# Patient Record
Sex: Male | Born: 1960 | Race: White | Hispanic: No | Marital: Married | Smoking: Former smoker
Health system: Southern US, Community
[De-identification: ages and names within clinical notes are randomized; demographics above are authoritative.]

## PROBLEM LIST (undated history)

## (undated) DIAGNOSIS — R945 Abnormal results of liver function studies: Secondary | ICD-10-CM

## (undated) DIAGNOSIS — R7989 Other specified abnormal findings of blood chemistry: Secondary | ICD-10-CM

## (undated) DIAGNOSIS — K2210 Ulcer of esophagus without bleeding: Secondary | ICD-10-CM

## (undated) DIAGNOSIS — K5792 Diverticulitis of intestine, part unspecified, without perforation or abscess without bleeding: Secondary | ICD-10-CM

## (undated) DIAGNOSIS — I10 Essential (primary) hypertension: Secondary | ICD-10-CM

## (undated) DIAGNOSIS — F419 Anxiety disorder, unspecified: Secondary | ICD-10-CM

## (undated) DIAGNOSIS — K222 Esophageal obstruction: Secondary | ICD-10-CM

## (undated) DIAGNOSIS — K5790 Diverticulosis of intestine, part unspecified, without perforation or abscess without bleeding: Secondary | ICD-10-CM

## (undated) HISTORY — DX: Anxiety disorder, unspecified: F41.9

## (undated) HISTORY — DX: Other specified abnormal findings of blood chemistry: R79.89

## (undated) HISTORY — DX: Essential (primary) hypertension: I10

## (undated) HISTORY — DX: Esophageal obstruction: K22.2

## (undated) HISTORY — DX: Ulcer of esophagus without bleeding: K22.10

## (undated) HISTORY — DX: Diverticulosis of intestine, part unspecified, without perforation or abscess without bleeding: K57.90

## (undated) HISTORY — DX: Diverticulitis of intestine, part unspecified, without perforation or abscess without bleeding: K57.92

## (undated) HISTORY — DX: Abnormal results of liver function studies: R94.5

---

## 1999-03-26 ENCOUNTER — Encounter: Payer: Self-pay | Admitting: Gastroenterology

## 1999-03-26 ENCOUNTER — Ambulatory Visit (HOSPITAL_COMMUNITY): Admission: RE | Admit: 1999-03-26 | Discharge: 1999-03-26 | Payer: Self-pay | Admitting: Gastroenterology

## 1999-04-30 ENCOUNTER — Ambulatory Visit (HOSPITAL_COMMUNITY): Admission: RE | Admit: 1999-04-30 | Discharge: 1999-04-30 | Payer: Self-pay | Admitting: Gastroenterology

## 1999-07-14 ENCOUNTER — Emergency Department (HOSPITAL_COMMUNITY): Admission: EM | Admit: 1999-07-14 | Discharge: 1999-07-14 | Payer: Self-pay | Admitting: Emergency Medicine

## 1999-08-06 ENCOUNTER — Encounter: Admission: RE | Admit: 1999-08-06 | Discharge: 1999-08-06 | Payer: Self-pay | Admitting: Gastroenterology

## 1999-08-06 ENCOUNTER — Encounter: Payer: Self-pay | Admitting: Gastroenterology

## 1999-08-06 ENCOUNTER — Encounter (INDEPENDENT_AMBULATORY_CARE_PROVIDER_SITE_OTHER): Payer: Self-pay | Admitting: *Deleted

## 1999-11-21 ENCOUNTER — Encounter: Payer: Self-pay | Admitting: Internal Medicine

## 1999-11-21 ENCOUNTER — Ambulatory Visit (HOSPITAL_COMMUNITY): Admission: RE | Admit: 1999-11-21 | Discharge: 1999-11-21 | Payer: Self-pay | Admitting: Internal Medicine

## 1999-12-26 ENCOUNTER — Encounter: Payer: Self-pay | Admitting: Internal Medicine

## 1999-12-26 ENCOUNTER — Ambulatory Visit (HOSPITAL_COMMUNITY): Admission: RE | Admit: 1999-12-26 | Discharge: 1999-12-26 | Payer: Self-pay | Admitting: Internal Medicine

## 2000-04-29 ENCOUNTER — Encounter: Payer: Self-pay | Admitting: Internal Medicine

## 2000-04-29 ENCOUNTER — Ambulatory Visit (HOSPITAL_COMMUNITY): Admission: RE | Admit: 2000-04-29 | Discharge: 2000-04-29 | Payer: Self-pay | Admitting: Internal Medicine

## 2000-10-21 ENCOUNTER — Ambulatory Visit (HOSPITAL_COMMUNITY): Admission: RE | Admit: 2000-10-21 | Discharge: 2000-10-21 | Payer: Self-pay | Admitting: Internal Medicine

## 2000-10-21 ENCOUNTER — Encounter: Payer: Self-pay | Admitting: Internal Medicine

## 2001-04-16 ENCOUNTER — Emergency Department (HOSPITAL_COMMUNITY): Admission: EM | Admit: 2001-04-16 | Discharge: 2001-04-16 | Payer: Self-pay | Admitting: Emergency Medicine

## 2001-04-16 ENCOUNTER — Encounter: Payer: Self-pay | Admitting: Emergency Medicine

## 2001-06-08 ENCOUNTER — Encounter: Payer: Self-pay | Admitting: Internal Medicine

## 2001-06-08 ENCOUNTER — Ambulatory Visit (HOSPITAL_COMMUNITY): Admission: RE | Admit: 2001-06-08 | Discharge: 2001-06-08 | Payer: Self-pay | Admitting: Internal Medicine

## 2003-06-26 ENCOUNTER — Encounter (INDEPENDENT_AMBULATORY_CARE_PROVIDER_SITE_OTHER): Payer: Self-pay | Admitting: *Deleted

## 2003-06-26 ENCOUNTER — Ambulatory Visit (HOSPITAL_COMMUNITY): Admission: RE | Admit: 2003-06-26 | Discharge: 2003-06-26 | Payer: Self-pay | Admitting: Internal Medicine

## 2004-07-03 ENCOUNTER — Ambulatory Visit: Payer: Self-pay | Admitting: Internal Medicine

## 2004-07-04 ENCOUNTER — Encounter (INDEPENDENT_AMBULATORY_CARE_PROVIDER_SITE_OTHER): Payer: Self-pay | Admitting: *Deleted

## 2004-07-04 ENCOUNTER — Emergency Department (HOSPITAL_COMMUNITY): Admission: EM | Admit: 2004-07-04 | Discharge: 2004-07-04 | Payer: Self-pay | Admitting: Emergency Medicine

## 2004-07-30 ENCOUNTER — Ambulatory Visit: Payer: Self-pay | Admitting: Internal Medicine

## 2005-02-26 ENCOUNTER — Encounter (INDEPENDENT_AMBULATORY_CARE_PROVIDER_SITE_OTHER): Payer: Self-pay | Admitting: *Deleted

## 2005-02-28 ENCOUNTER — Encounter (INDEPENDENT_AMBULATORY_CARE_PROVIDER_SITE_OTHER): Payer: Self-pay | Admitting: *Deleted

## 2005-03-04 ENCOUNTER — Ambulatory Visit: Payer: Self-pay | Admitting: Internal Medicine

## 2008-08-09 ENCOUNTER — Telehealth: Payer: Self-pay | Admitting: Internal Medicine

## 2009-05-01 ENCOUNTER — Ambulatory Visit: Payer: Self-pay | Admitting: Family Medicine

## 2010-05-05 ENCOUNTER — Telehealth: Payer: Self-pay | Admitting: Internal Medicine

## 2010-05-05 DIAGNOSIS — K222 Esophageal obstruction: Secondary | ICD-10-CM

## 2010-05-05 DIAGNOSIS — Z862 Personal history of diseases of the blood and blood-forming organs and certain disorders involving the immune mechanism: Secondary | ICD-10-CM

## 2010-05-05 DIAGNOSIS — K2210 Ulcer of esophagus without bleeding: Secondary | ICD-10-CM

## 2010-05-05 DIAGNOSIS — R198 Other specified symptoms and signs involving the digestive system and abdomen: Secondary | ICD-10-CM

## 2010-05-05 DIAGNOSIS — F411 Generalized anxiety disorder: Secondary | ICD-10-CM | POA: Insufficient documentation

## 2010-05-05 DIAGNOSIS — R109 Unspecified abdominal pain: Secondary | ICD-10-CM | POA: Insufficient documentation

## 2010-05-05 DIAGNOSIS — Z8639 Personal history of other endocrine, nutritional and metabolic disease: Secondary | ICD-10-CM

## 2010-05-06 ENCOUNTER — Ambulatory Visit: Payer: Self-pay | Admitting: Internal Medicine

## 2010-05-06 LAB — CONVERTED CEMR LAB
Basophils Absolute: 0 10*3/uL (ref 0.0–0.1)
Basophils Relative: 0.5 % (ref 0.0–3.0)
Eosinophils Absolute: 0.2 10*3/uL (ref 0.0–0.7)
Eosinophils Relative: 2.6 % (ref 0.0–5.0)
HCT: 43.5 % (ref 39.0–52.0)
Hemoglobin: 15.1 g/dL (ref 13.0–17.0)
Lymphocytes Relative: 23.8 % (ref 12.0–46.0)
Lymphs Abs: 1.6 10*3/uL (ref 0.7–4.0)
MCHC: 34.7 g/dL (ref 30.0–36.0)
MCV: 89 fL (ref 78.0–100.0)
Monocytes Absolute: 0.7 10*3/uL (ref 0.1–1.0)
Monocytes Relative: 10.1 % (ref 3.0–12.0)
Neutro Abs: 4.4 10*3/uL (ref 1.4–7.7)
Neutrophils Relative %: 63 % (ref 43.0–77.0)
Platelets: 189 10*3/uL (ref 150.0–400.0)
RBC: 4.89 M/uL (ref 4.22–5.81)
RDW: 13.4 % (ref 11.5–14.6)
WBC: 6.9 10*3/uL (ref 4.5–10.5)

## 2010-05-07 ENCOUNTER — Ambulatory Visit: Payer: Self-pay | Admitting: Cardiology

## 2010-07-18 ENCOUNTER — Ambulatory Visit: Admit: 2010-07-18 | Payer: Self-pay | Admitting: Internal Medicine

## 2010-08-05 NOTE — Letter (Signed)
Summary: SER-Abdomen Spine and Erect View  SER-Abdomen Spine and Erect View   Imported By: Lamona Curl CMA (AAMA) 05/05/2010 17:02:54  _____________________________________________________________________  External Attachment:    Type:   Image     Comment:   External Document

## 2010-08-05 NOTE — Procedures (Signed)
Summary: EGD  Patient Name: Chad [name], Chad [name] MRN: [id number] Procedure Procedures: Panendoscopy (EGD) CPT: 43235.    with esophageal dilation. CPT: G9296129.  Personnel: Endoscopist: Dora L. Juanda Chance, MD.  Exam Location: Exam performed in Endoscopy Suite. Outpatient  Patient Consent: Procedure, Alternatives, Risks and Benefits discussed, consent obtained, from patient.  Indications  Therapeutics: Reason for exam: Esophageal dilation.  Symptoms: Dysphagia.  Surveillance of: follow up esophageal  stricture, recurrent solid food dysphagia.  Comments: Savory dilators passed under fluro  without difficulty 14, 15 , 16, 17,mm there was blood on the last dilator History  Pre-Exam Physical: Performed Apr 29, 2000  Cardio-pulmonary exam, HEENT exam, Abdominal exam, Extremity exam, Neurological exam WNL.  Exam Exam Info: Maximum depth of insertion Duodenum, intended Duodenum. Gastric retroflexion performed. Images taken. ASA Classification: I. Tolerance: excellent.  Sedation Meds: Demerol 100 mg. Versed 12.5  Findings STRICTURE / STENOSIS: Proximal Esophagus.  Constriction: partial. Etiology: benign other, see comments. 15 cm from mouth. ICD9: Esophageal Stricture: 530.3.   Assessment Abnormal examination, see findings above.  Diagnoses: 530.3: Esophageal Stricture.   Events  Unplanned Intervention: No unplanned interventions were required.  Unplanned Events: There were no complications. Plans Medication(s): Continue current medications.  Patient Education: Patient given standard instructions for: Stenosis / Stricture.   This report was created from the original endoscopy report, which was reviewed and signed by the above listed endoscopist.

## 2010-08-05 NOTE — Assessment & Plan Note (Signed)
Summary: Gastroenterology  Chad [name] MR#:  [id number] Page #  NAME:  Chad [name], Chad [name]  OFFICE NO:  [id number]  DATE:  03/04/05  DOB:  [birth year]  HISTORY OF PRESENT ILLNESS:  The patient is a very nice 50 year old gentleman with a benign esophageal stricture requiring periodic dilatation, last time in December 2005. He had a new problem with acute left lower quadrant abdominal pain, which woke him up approximately 2 weeks ago and lasted several days. It subsided spontaneously without any treatment. It was located anteriorly, did not radiate to the back or to his legs. He has normal bowel habits and has no nausea, vomiting, or food intolerance. There was no fever or rectal bleeding. There is a family history of colitis in his father.  When seen by Dr. Susann Givens, ultrasound of the upper abdomen was unremarkable, including his gallbladder; amylase, lipase. His liver transaminases have been slightly elevated 2 to 3 times normal with normal alkaline phosphatase and serum albumin. He has been told in the past, as long as he remembers, that his liver functions have been abnormal. He denies using alcohol excessively or having family history of liver disease. He has never had blood transfusions or used illicit drugs. His hepatitis A, B, C serologies have been negative as per Dr. Susann Givens.  PHYSICAL EXAM:  Blood pressure 120/60, pulse of 72, and weight 195 pounds. He appeared healthy, well suntanned. No rashes, no palmar erythema, and sclerae nonicteric. Lungs were clear to auscultation. COR with normal S1 and normal S2. Abdomen was soft, mildly protuberant, with normal liver edge at costal margin; spleen not enlarged; minimal tenderness in left lower quadrant; no rebound; no fullness. Rectal exam with normal prostate; stool was Hemoccult negative.  IMPRESSION: 1.  A 50 year old gentleman with acute left lower quadrant abdominal pain, which has resolved spontaneously without antibiotics or dietary restrictions. It could  represent a colon spasm, symptomatic diverticulosis, or possibly segmental ischemic colitis. The pain has resolved. He has no risk factors for colon cancer, and he is only 50 years old. Therefore, colonoscopy is not required. 2.  Benign distal esophageal stricture. 3.  Abnormal liver function tests, specifically transaminases; unknown etiology. Rule out Wilson disease. Rule out hemochromatosis. Rule out autoimmune liver disease and also A-1 antitrypsin deficiency.  PLAN: 1.  Obtain antimitochondrial antibody, antinuclear antibodies, smooth muscle antibodies, serum ferritin, IgG and IgM levels. 2.  Continue AcipHex 20 mg p.o. b.i.d. 3.  If the pain in left lower quadrant recurs, I would like to see him and do a CT scan with attention to left lower quadrant. 4.  Samples of Pamine 5 mg to use as an antispasmodic p.r.n. abdominal pain.      Hedwig Morton. Juanda Chance, M.D.  [id number] cc:  Ronnald Nian, MD D:  03/04/05; T:  ; Job [phone]

## 2010-08-05 NOTE — Assessment & Plan Note (Signed)
Summary: abd pain, loose stool x week/regina    History of Present Illness Visit Type: Initial Visit Primary GI MD: Lina Sar MD Primary Provider: [facility] Chief Complaint: LLQ pain, loose bloody stools x 8 days this episode History of Present Illness:   This is a 50 year old white male with a history of a benign proximal esophageal stricture now with new complains of a one week history of left lower quadrant abdominal pain which has bothered him during the day as well as at night. There was a low-grade temperature 2 days ago. His bowel habits have been regular and he denies any upper GI symptoms. He has never had a colonoscopy. He has seen some blood  mixed with bowel movements. There is no family history of colon cancer.   GI Review of Systems    Reports abdominal pain, acid reflux, and  dysphagia with solids.     Location of  Abdominal pain: RLQ.    Denies belching, bloating, chest pain, dysphagia with liquids, heartburn, loss of appetite, nausea, vomiting, vomiting blood, weight loss, and  weight gain.      Reports change in bowel habits, diarrhea, and  rectal bleeding.     Denies anal fissure, black tarry stools, constipation, diverticulosis, fecal incontinence, heme positive stool, hemorrhoids, irritable bowel syndrome, jaundice, light color stool, liver problems, and  rectal pain. Preventive Screening-Counseling & Management  Alcohol-Tobacco     Smoking Status: quit    Current Medications (verified): 1)  Xanax 0.5 Mg Tabs (Alprazolam) .... As Needed 2)  Omeprazole 20 Mg Cpdr (Omeprazole) .... Once Daily 3)  Minocycline Hcl 100 Mg Tabs (Minocycline Hcl) .... Once Weekly  Allergies (verified): No Known Drug Allergies  Past History:  Past Medical History: Reviewed history from 05/05/2010 and no changes required. Current Problems:  Hx of ULCER OF ESOPHAGUS WITHOUT BLEEDING (ICD-530.20) ANXIETY (ICD-300.00) STRICTURE AND STENOSIS OF ESOPHAGUS  (ICD-530.3) LIVER FUNCTION TESTS, ABNORMAL, HX OF (ICD-V12.2)    Past Surgical History: Reviewed history from 05/05/2010 and no changes required. Unremarkable  Family History: Reviewed history from 05/05/2010 and no changes required. No FH of Colon Cancer:  Social History: Alcohol Use - yes-rare Illicit Drug Use - no Occupation: Nature conservation officer Patient is a former smoker.  Daily Caffeine Use Smoking Status:  quit  Review of Systems       The patient complains of anxiety-new and change in vision.  The patient denies allergy/sinus, anemia, arthritis/joint pain, back pain, blood in urine, breast changes/lumps, confusion, cough, coughing up blood, depression-new, fainting, fatigue, fever, headaches-new, hearing problems, heart murmur, heart rhythm changes, itching, menstrual pain, muscle pains/cramps, night sweats, nosebleeds, pregnancy symptoms, shortness of breath, skin rash, sleeping problems, sore throat, swelling of feet/legs, swollen lymph glands, thirst - excessive , urination - excessive , urination changes/pain, urine leakage, vision changes, and voice change.         Pertinent positive and negative review of systems were noted in the above HPI. All other ROS was otherwise negative.   Vital Signs:  Patient profile:   50 year old male Height:      72 inches Weight:      205.25 pounds BMI:     27.94 Pulse rate:   68 / minute Pulse rhythm:   regular BP sitting:   124 / 96  (left arm) Cuff size:   regular  Vitals Entered By: June McMurray CMA Duncan Dull) (May 06, 2010 10:41 AM)  Physical Exam  General:  Well developed, well nourished, no  acute distress. Eyes:  PERRLA, no icterus. Mouth:  No deformity or lesions, dentition normal. Neck:  Supple; no masses or thyromegaly. Lungs:  Clear throughout to auscultation. Heart:  Regular rate and rhythm; no murmurs, rubs,  or bruits. Abdomen:  soft abdomen with normal bowel sounds. Mild tenderness on deep pressure and left  lower quadrant and left middle quadrants without palpable mass or fullness. Upper quadrants were normal, liver edge at costal margin. Right lower quadrant unremarkable. Rectal:  soft trace Hemoccult-positive stool. Extremities:  No clubbing, cyanosis, edema or deformities noted. Skin:  Intact without significant lesions or rashes. Psych:  Alert and cooperative. Normal mood and affect.   Impression & Recommendations:  Problem # 1:  ABDOMINAL PAIN, UNSPECIFIED SITE (ICD-789.00) Patient has acute left lower quadrant abdominal pain suggestive of either colitis or diverticulitis. He has hemoccult-Positive stool on my exam and also reports blodd in the BM's.. His low-grade temperature points to an inflammatory process but the bleeding raises a concern for colitis or possibly for neoplasm. We will proceed with a CT scan of the abdomen and pelvis with intravenous and oral contrast and start the patient on Cipro 500 mg twice a day and Flagyl 250 mg 3 times a day. He was instructed in a full liquid diet and we will check a CBC today. Orders: CT Abdomen/Pelvis with Contrast (CT Abd/Pelvis w/con) TLB-CBC Platelet - w/Differential (85025-[id number])  Problem # 2:  Hx of ULCER OF ESOPHAGUS WITHOUT BLEEDING (ICD-530.20) Patient is status post esophageal dilatation. His last dilation was in December 2005. He denies any dysphagia. He is on omeprazole 20 mg daily.  Patient Instructions: 1)  You have been scheduled for a CT scan of the abdomen and pelvis at [place] [facility] on 1:30 pm on 05/07/10 2)  Cipro 500 mg twice a day for 10 days. 3)  Flagyl 250 mg 3 times a day x 10 days. 4)  Full liquid diet. 5)  CBC with differential today. 6)  Please pick up your prescriptions at the pharmacy. Electronic prescription(s) has already been sent.  7)  Copy sent to : [facility] 8)  The medication list was reviewed and reconciled.  All changed / newly prescribed medications were explained.  A complete medication list was  provided to the patient / caregiver. Prescriptions: CIPRO 500 MG TABS (CIPROFLOXACIN HCL) Take 1 tablet by mouth two times a day x 10 days  #20 x 1   Entered by:   Lamona Curl CMA (AAMA)   Authorized by:   Hart Carwin MD   Signed by:   Lamona Curl CMA (AAMA) on 05/06/2010   Method used:   Electronically to        [facility]  [address] [phone]* (retail)       [address] [place], [place]  [address]       Ph: [phone] or [phone]       Fax: [phone]   RxID:   [id number] FLAGYL 250 MG TABS (METRONIDAZOLE) Take 1 tablet by mouth three times a day x 10 days  #30 x 1   Entered by:   Lamona Curl CMA (AAMA)   Authorized by:   Hart Carwin MD   Signed by:   Lamona Curl CMA (AAMA) on 05/06/2010   Method used:   Electronically to        [facility]  [address] [phone]* (retail)  [address]       [place], [place]  [address]       Ph: [phone] or [phone]       Fax: [phone]   RxID:   [id number]

## 2010-08-05 NOTE — Letter (Signed)
Summary: SER-Abdominal Ultrasound  SER-Abdominal Ultrasound   Imported By: Lamona Curl CMA (AAMA) 05/05/2010 17:02:16  _____________________________________________________________________  External Attachment:    Type:   Image     Comment:   External Document

## 2010-08-05 NOTE — Op Note (Signed)
Summary: EGD                         [facility]  Patient:    OLA, FAWVER Visit Number: [id number] MRN: [id number]          Service Type: END Location: ENDO Attending Physician:  Mervin Hack Dictated by:   Hedwig Morton. Juanda Chance, M.D. [facility] Admit Date:  06/08/2001                             Procedure Report  PROCEDURE:  Upper endoscopy with esophageal dilation.  INDICATION:  This 50 year old gentleman has a history of benign proximal esophageal stricture.  He was dilated on April 29, 2000, and again on October 21, 2000, using Savary dilators, dilatation was up to 17 mm.  He has been on Prevacid 30 mg a day and the past four weeks has developed progressive solid-food dysphagia.  He could hardly eat his Thanksgiving dinner, had to blenderize his [place] meat.  He is now undergoing a repeat endoscopy with dilatation.  ENDOSCOPE:  Olympus single-channel video scope.  SEDATION:  Versed 10 mg IV, Demerol 90 mg IV.  FINDINGS:  The Olympus single-channel video scope passed under direct vision through posterior pharynx into the esophagus.  The patient was monitored by pulse oximetry.  His oxygen saturations were normal, and he was cooperative. There was a proximal esophageal stricture at 17 cm from the incisors, which showed closed lumen.  Endoscope traversed through with only mild pressure. The rest of the stomach was normal with mild fibrous ring, which was not obstructing at the GE junction, with a small hiatal hernia.  Stomach was normal as well as duodenum and duodenal bulb.  A guidewire was then placed under fluoroscopic guidance and Savary dilators passed over the guidewire, using 12, 14, 15, 16, 17, and 18 mm dilators.  There was mild small amount of blood on the last three dilators.   The patient tolerated the procedure well.  IMPRESSION:  Benign proximal esophageal stricture, status post dilatation to 18 mm.  PLAN: 1. Increase Prevacid to 30 mg p.o.  b.i.d. 2. Continue antireflux measures. 3. Repeat dilatation on a p.r.n. basis. Dictated by:   Hedwig Morton. Juanda Chance, M.D. [facility] Attending Physician:  Mervin Hack DD:  06/08/01 TD:  06/08/01 Job: [phone] [id number]

## 2010-08-05 NOTE — Progress Notes (Signed)
Summary: Triage   Phone Note Call from Patient Call back at Home Phone [phone]   Caller: Patient Call For: Dr. Juanda Chance Reason for Call: Talk to Nurse Summary of Call: Diarrhea and lower abd pain x1 week Initial call taken by: Karna Christmas,  May 05, 2010 8:51 AM  Follow-up for Phone Call        Patient called requesting an appointment to see Dr. Juanda Chance. States he has had  left lower abdominal pain for one week. Describes pain as waves of knife like pain. States he as loose, brown stool with some mucous about every hour. Reports one espisode of blood with stool last week but he cannot describe if it was bright red or dark red blood. Denies fever, nausea and vomiting. Patient reports an episode like this 2 months ago that resolved on its on. Patient is taking Tylenol without relief. Patient offered appointment with PA today or Dr. Juanda Chance tomorrow. Scheduled patient to see Dr. Juanda Chance 05/06/10 @ 10:15 AM. Follow-up by: Jesse Fall RN,  May 05, 2010 9:41 AM

## 2010-08-05 NOTE — Procedures (Signed)
Summary: EGD  [facility]] Patient Name: Chad [name], Osuna MRN: [id number] Procedure Procedures: Panendoscopy (EGD) CPT: 43235.    with biopsy(s)/brushing(s). CPT: D1846139.    with esophageal dilation. CPT: G9296129.  Personnel: Endoscopist: Dora L. Juanda Chance, MD.  Exam Location: Exam performed in Outpatient Clinic. Outpatient  Patient Consent: Procedure, Alternatives, Risks and Benefits discussed, consent obtained, from patient. Consent was obtained by the RN.  Indications Symptoms: Dysphagia.  Surveillance of: 2004.  Comments: history of esophageal stricture History  Current Medications: Patient is not currently taking Coumadin.  Pre-Exam Physical: Performed Jul 03, 2004  Entire physical exam was normal.  Exam Exam Info: Maximum depth of insertion Duodenum, intended Duodenum. Vocal cords visualized. Gastric retroflexion performed. Images taken. ASA Classification: I. Tolerance: good.  Sedation Meds: Patient assessed and found to be appropriate for moderate (conscious) sedation. Fentanyl 100 mcg. given IV. Versed 10 mg. given IV. Cetacaine Spray 2 sprays given aerosolized.  Monitoring: BP and pulse monitoring done. Oximetry used. Supplemental O2 given  Findings - STRICTURE / STENOSIS: Stricture in Proximal Esophagus.  Constriction: partial. Etiology: benign due to reflux. 15 cm from mouth. Lumen diameter is 15 mm. ICD9: Esophageal Stricture: 530.3. Comment: no significant narrowing of the lumen noted.  - Dilation: Proximal Esophagus. Maloney dilator used, Diameter: 42,48, 50,54 mm, Minimal Resistance, Moderate Heme present on extraction. 4  total dilators used. Patient tolerance fair. Outcome: successful.  - DIAGNOSTIC TEST: Biopsy taken. from Distal Esophagus. Reason: r/o Barrett's esophagus.   Assessment Abnormal examination, see findings above.  Diagnoses: 530.3: Esophageal Stricture.   Comments: s/p dilation of proximal es.stricture, s/p  biopsies Events  Unplanned Intervention: No unplanned interventions were required.  Unplanned Events: There were no complications. Plans Medication(s): Await pathology. PPI: Aciphex 20 mg BID, starting Jul 03, 2004   Comments: will need to increase PPI's to bid regimen to reduce the frequency of the es. dilations Disposition: After procedure patient sent to recovery. After recovery patient sent home.    This report was created from the original endoscopy report, which was reviewed and signed by the above listed endoscopist.

## 2010-08-05 NOTE — Op Note (Signed)
Summary: EGD  NAME:  Chad [name], MCINTYRE                          ACCOUNT NO.:  [id number]   MEDICAL RECORD NO.:  [id number]                   PATIENT TYPE:  AMB   LOCATION:  ENDO                                 FACILITY:  [facility]   PHYSICIAN:  Lina Sar, M.D. [facility]               DATE OF BIRTH:  March 20, 1961   DATE OF PROCEDURE:  06/26/2003  DATE OF DISCHARGE:                                 OPERATIVE REPORT   NAME OF PROCEDURE:  Upper endoscopy and esophageal dilation.   INDICATIONS:  This 50 year old gentleman has a benign proximal esophageal  stricture which was dilated on several previous occasions, first in October  2001, the last time in December 2002.  He has been fine for two years, but  in the last month has developed a recurrent solid food dysphagia.  He has  been on Prevacid 30 mg p.o. b.i.d., and antireflux measures.   ENDOSCOPE:  Olympus single chamber scope.   SEDATION:  1. Versed 12 mg IV.  2. Fentanyl 100 mcg IV.   FINDINGS:  The Olympus single chamber video endoscope passed under direct  visualization into the oral cavity through the posterior pharynx into the  distal esophagus.  The patient was monitored by pulse oximeter.  His oxygen  saturations were normal.  He was cooperative.  There was a proximate  esophageal stricture at 17 cm from the incisors which appeared to have a  spasm and erythematous mucosa.  Endoscope passed through without much  resistance, but the lumen did not stay open, I had to push through several  times back and forth to keep it open.  The rest of the esophagus was  unremarkable.  There was no stricture distally.  There was only small  __________ reducible hiatal hernia.   Stomach was insufflated with air and showed normal appearing gastric mucosa.  Gastric antrum and the folds.  Retroflexion of the endoscope revealed normal  fundus and cardia.   The duodenum bulb and descending duodenum was normal.  Guide wire was then  placed into the  stomach and Savary dilator passed over the guide wire using  12, 14, 15, 16, 17, and 18 mm dilators.  There was blood on the last two  dilators.  The patient tolerated the procedure well.   IMPRESSION:  Benign proximal esophageal stricture, status post dilatation to  18 mm.   PLAN:  1. We are going to switch the patient from Prevacid to Nexium 40 mg p.o.     b.i.d.  2. Anti-reflux measures.  3. To return for re-dilatation on a p.r.n. basis.                                               Lina Sar, M.D. [facility]   DB/MEDQ  D:  06/26/2003  T:  06/26/2003  Job:  [id number]

## 2010-11-21 NOTE — Procedures (Signed)
[facility]  Patient:    Chad [name], Chad [name]                       MRN: [id number] Adm. Date:  [id number] Attending:  Mervin Hack                           Procedure Report  PROCEDURE:  Upper endoscopy with esophageal dilatation.  ENDOSCOPIST:  Hedwig Morton. Juanda Chance, M.D.  INDICATION:  This 50 year old gentleman has a history of benign proximal esophageal stricture which was dilated on two previous occasions, last time on April 29, 2000, using Savary dilators under fluoroscopic guidance up to 17 mm.  He has done very well for five months, until several days ago when he developed acute solid food dysphagia and has had persistent odynophagia and difficulty with swallowing solids.  He has been on Prevacid 30 mg a day.  He is now undergoing upper endoscopy and esophageal dilatation.  ENDOSCOPE:  Olympus single-channel video endoscope.  SEDATION:  Versed 7.5 mg IV, Demerol 100 mg IV.  FINDINGS:  Olympus single-channel video endoscope was passed under direct vision to posterior pharynx into esophagus.  Patient was monitored by pulse oximetry; his oxygen saturations were normal.  He was very cooperative.  In the proximal esophagus at the level of 15 to 20 cm from the oral cavity was a marked fibrotic esophageal stricture which allowed the endoscope to traverse through without bleeding but with only minimal resistance.  There were no ulcerations or erosions.  The rest of the esophagus was unremarkable.  Stomach:  Gastric mucosa was normal throughout with normal pyloric outlet. Retroflexion of the scope revealed normal fundus and cardia.  Duodenum:  Duodenal bulb and descending duodenum were normal.  A guidewire was then placed through the endoscope under fluoroscopic guidance. Endoscope was retracted and Savary dilators passed over the guidewire using fluoroscopy for confirmation; 14-mm, 16-mm, 17-mm and 18-mm Savary dilators passed through the esophagus  with mild resistance.  There was a small amount of blood on the last two dilators.  Patient tolerated procedure well.  IMPRESSION:  Benign proximal esophageal stricture, status post dilatation to 18 mm.  PLAN: 1. Continue Prevacid 30 mg a day. 2. Liquids for two hours, then soft foods. 3. Patient will return for repeat dilatation on a p.r.n. basis. DD:  10/21/00 TD:  10/22/00 Job: [id number]

## 2010-11-21 NOTE — Procedures (Signed)
[place]  Patient:    Chad [name], Chad [name]                       MRN: [id number] Proc. Date: 11/21/99 Adm. Date:  [id number] Disc. Date: [id number] Attending:  Mervin Hack CC:         Lucky Cowboy, M.D.                           Procedure Report  PROCEDURE PERFORMED:  Upper endoscopy with Savary dilation.  ENDOSCOPIST:  Hedwig Morton. Juanda Chance, M.D. [facility]  INDICATIONS FOR PROCEDURE:  This 50 year old white male has chronic solid food dysphagia.  He was diagnosed with benign proximal esophageal stricture in 1993 and the stricture was asymptomatic for several years but about a year ago, he developed progressive solid food dysphagia.  He has some from 186 to currently 155 pounds.  He denies odynophagia.  Barium swallow in April 2000 compared with recent barium swallow confirms small proximal esophageal stricture, most likely benign with maximum diameter of 9 mm.  There was a question of Barretts esophagus.  The patient has been on Prevacid which he empties into a glass of water because he cannot swallow pills.  He has been essentially on liquids only.  He is undergoing upper endoscopy and esophageal dilation of the proximal esophageal stricture.  ENDOSCOPE:  Olympus Signature endoscope.  SEDATION:  Versed 12 mg IV, Demerol 100 mg IV.  DESCRIPTION OF PROCEDURE:  Olympus Signature endoscope passed ____________ posterior pharynx into esophagus.  Patient  monitored by pulse oximetery, oxygen saturations were 98 to 100%.  He was very cooperative.  Gag reflex was present.  Vallecula and piriform sinuses were unremarkable.  Proximal esophagus from 15 to 25 cm from the incisors, lumen was concentrically narrowed and would not allow the endoscope initially to pass through.  The diameter was less than 11 mm.  The mucosa appeared whitish, noninflamed, no erosions or ulcerations.  As the endoscope pushed through the proximal esophagus, there were some abrasions in  the mucosa of the esophageal stricture.  Guide wire was then placed through the endoscope into the stomach under fluoroscopic guidance.  From that point we were able to advance the endoscope all the way through the esophagus.  No significant distal strictures were noted.  There was a small sliding hiatal hernia which was not clinically significant ____________ gastric folds, gastric antrum and pylorus ____________ normal as well as duodenum.  As the endoscope was retracted the Savary dilators passed over the guide wire under fluoroscopic guidance using 12 mm, 12.8, 14 and 15 mm dilators.  There was a little blood on the last dilator and some blood on the 14 mm dilator.  The patient tolerated the procedure well.  IMPRESSION:  Benign proximal and distal esophageal stricture, status post dilation to 45 [place].  Chronic esophagitis.  PLAN: 1. Patient will have to return in four weeks for redilatation. 2. Begin Prilosec elixir 20 mg twice a day. 3. Antireflux measures and soft diet with dietary supplement to improve his    nutritional status.  I suspected the problem with the stricture is also    partly due to dysmotility over the proximal esophageal segment which lacks    peristaltic waves and compounds the problem of dysphagia. DD:  11/21/99 TD:  11/26/99 Job: [id number]

## 2010-11-21 NOTE — Procedures (Signed)
[facility]  Patient:    SONIA, STICKELS Visit Number: [id number] MRN: [id number]          Service Type: END Location: ENDO Attending Physician:  Mervin Hack Dictated by:   Hedwig Morton. Juanda Chance, M.D. [facility] Admit Date:  06/08/2001                             Procedure Report  PROCEDURE:  Upper endoscopy with esophageal dilation.  INDICATION:  This 50 year old gentleman has a history of benign proximal esophageal stricture.  He was dilated on April 29, 2000, and again on October 21, 2000, using Savary dilators, dilatation was up to 17 mm.  He has been on Prevacid 30 mg a day and the past four weeks has developed progressive solid-food dysphagia.  He could hardly eat his Thanksgiving dinner, had to blenderize his [place] meat.  He is now undergoing a repeat endoscopy with dilatation.  ENDOSCOPE:  Olympus single-channel video scope.  SEDATION:  Versed 10 mg IV, Demerol 90 mg IV.  FINDINGS:  The Olympus single-channel video scope passed under direct vision through posterior pharynx into the esophagus.  The patient was monitored by pulse oximetry.  His oxygen saturations were normal, and he was cooperative. There was a proximal esophageal stricture at 17 cm from the incisors, which showed closed lumen.  Endoscope traversed through with only mild pressure. The rest of the stomach was normal with mild fibrous ring, which was not obstructing at the GE junction, with a small hiatal hernia.  Stomach was normal as well as duodenum and duodenal bulb.  A guidewire was then placed under fluoroscopic guidance and Savary dilators passed over the guidewire, using 12, 14, 15, 16, 17, and 18 mm dilators.  There was mild small amount of blood on the last three dilators.   The patient tolerated the procedure well.  IMPRESSION:  Benign proximal esophageal stricture, status post dilatation to 18 mm.  PLAN: 1. Increase Prevacid to 30 mg p.o. b.i.d. 2. Continue  antireflux measures. 3. Repeat dilatation on a p.r.n. basis. Dictated by:   Hedwig Morton. Juanda Chance, M.D. [facility] Attending Physician:  Mervin Hack DD:  06/08/01 TD:  06/08/01 Job: [id number]

## 2010-11-21 NOTE — Op Note (Signed)
NAME:  Chad [name], SERVISS NO.:  [id number]   MEDICAL RECORD NO.:  [id number]                   PATIENT TYPE:  AMB   LOCATION:  ENDO                                 FACILITY:  [facility]   PHYSICIAN:  Lina Sar, M.D. [facility]               DATE OF BIRTH:  1960-07-16   DATE OF PROCEDURE:  06/26/2003  DATE OF DISCHARGE:                                 OPERATIVE REPORT   NAME OF PROCEDURE:  Upper endoscopy and esophageal dilation.   INDICATIONS:  This 50 year old gentleman has a benign proximal esophageal  stricture which was dilated on several previous occasions, first in October  2001, the last time in December 2002.  He has been fine for two years, but  in the last month has developed a recurrent solid food dysphagia.  He has  been on Prevacid 30 mg p.o. b.i.d., and antireflux measures.   ENDOSCOPE:  Olympus single chamber scope.   SEDATION:  1. Versed 12 mg IV.  2. Fentanyl 100 mcg IV.   FINDINGS:  The Olympus single chamber video endoscope passed under direct  visualization into the oral cavity through the posterior pharynx into the  distal esophagus.  The patient was monitored by pulse oximeter.  His oxygen  saturations were normal.  He was cooperative.  There was a proximate  esophageal stricture at 17 cm from the incisors which appeared to have a  spasm and erythematous mucosa.  Endoscope passed through without much  resistance, but the lumen did not stay open, I had to push through several  times back and forth to keep it open.  The rest of the esophagus was  unremarkable.  There was no stricture distally.  There was only small  __________ reducible hiatal hernia.   Stomach was insufflated with air and showed normal appearing gastric mucosa.  Gastric antrum and the folds.  Retroflexion of the endoscope revealed normal  fundus and cardia.   The duodenum bulb and descending duodenum was normal.  Guide wire was then  placed into the stomach and Savary  dilator passed over the guide wire using  12, 14, 15, 16, 17, and 18 mm dilators.  There was blood on the last two  dilators.  The patient tolerated the procedure well.   IMPRESSION:  Benign proximal esophageal stricture, status post dilatation to  18 mm.   PLAN:  1. We are going to switch the patient from Prevacid to Nexium 40 mg p.o.     b.i.d.  2. Anti-reflux measures.  3. To return for re-dilatation on a p.r.n. basis.                                               Lina Sar, M.D. [facility]   DB/MEDQ  D:  06/26/2003  T:  06/26/2003  Job:  [id number]

## 2010-11-21 NOTE — Op Note (Signed)
[facility]  Patient:    JALEEN, FINCH                       MRN: [id number] Adm. Date:  [id number] Disc. Date: [id number] Attending:  Mervin Hack CC:         Everardo All. Madilyn Fireman, M.D.                           Operative Report  PROCEDURE:  Upper endoscopy and esophageal dilation.  INDICATION:  This 50 year old gentleman has been evaluated for a proximal esophageal stricture and solid food dysphagia with resulting weight loss.  He underwent his first proximal esophageal dilatation on Nov 21, 1999 using Savary dilators up to 15 mm.  He had three to four days of burning and some hematemesis but his dysphagia has mostly resolved.  He has gained some weight, his energy has improved and he has been able to eat solid food.  He still has had occasional hesitation of the food and for that reason, he is undergoing a repeat esophageal dilatation four weeks later.  ENDOSCOPE:  Olympus single-channel videoscope.  ENDOSCOPIST:  Hedwig Morton. Juanda Chance, M.D.  SEDATION:  Versed 10 mg IV, Demerol 100 mg IV.  FINDINGS:  Olympus single-channel videoscope was passed under direct vision through the posterior pharynx into the esophagus.  Patient was again monitored by pulse oximetry; his oxygen saturations were normal.  At this time, endoscope advanced through the proximal esophagus without hesitation.  There was only slight hindrance proximally at the area of smooth narrowing at the level of 15 cm from the incisors.  There was no active erosion, laceration or any sign of acute inflammation.  The stricture was smooth, fibrotic, easily dilated with the scope.  Diameter of the stricture at this time was at least 12 mm.  Rest of the esophagus was unremarkable.  Guidewire was then placed into the stomach with fluoroscopic guidance and Savary dilators passed over the guidewire using 12.8-, 14.0-, 15.0- and 16.0-mm dilators.  There was a significant amount of blood on the last  dilator.  Patient tolerated the procedure well.  IMPRESSION: 1. Proximal esophageal stricture, status post dilatation to 48-French. 2. Proximal esophagitis.  PLAN: 1. Patient is to continue on the Prilosec 20 mg a day. 2. Antireflux measures with head of bed elevation at night. 3. He will return in eight weeks for repeat dilatation and re-endoscopy.  At    that time, we may be able to dilate him to 51-French and from there on, he    may not need further dilatation.  He will have to stay on chronic PPIs.  We    will monitor his progress as an outpatient in the office. DD:  12/26/99 TD:  12/29/99 Job: [id number]

## 2011-09-14 ENCOUNTER — Telehealth: Payer: Self-pay | Admitting: Internal Medicine

## 2011-09-14 NOTE — Telephone Encounter (Signed)
Left a message for patient's wife to call me. 

## 2011-09-14 NOTE — Telephone Encounter (Signed)
Spoke with patient's wife and she states he went to the ED at [facility] over the weekend due to getting a pill stuck in throat. They gave him something to relax and he coughed up the pill. He is eating a soft diet. Scheduled patient with Willette Cluster, NP on 3/112/13 at 2:00 PM. Hx benign proximal esophageal stricture.

## 2011-09-15 ENCOUNTER — Encounter: Payer: Self-pay | Admitting: Internal Medicine

## 2011-09-15 ENCOUNTER — Ambulatory Visit (INDEPENDENT_AMBULATORY_CARE_PROVIDER_SITE_OTHER): Payer: BC Managed Care – PPO | Admitting: Nurse Practitioner

## 2011-09-15 ENCOUNTER — Encounter: Payer: Self-pay | Admitting: Nurse Practitioner

## 2011-09-15 DIAGNOSIS — R1310 Dysphagia, unspecified: Secondary | ICD-10-CM | POA: Insufficient documentation

## 2011-09-15 NOTE — Progress Notes (Signed)
i agree with plan outlined in this note 

## 2011-09-15 NOTE — Patient Instructions (Signed)
We scheduled the colonoscopy with Dr. Juanda Chance. Directions provided.

## 2011-09-15 NOTE — Progress Notes (Signed)
Chad [name] [id number] 04-12-1961   HISTORY OR PRESENT ILLNESS :  Patient is a 51 year old male known to Dr. Juanda Chance for a history of sigmoid diverticulitis and recurrent proximal esophageal strictures requiring dilation (last dilated in 2005). Patient has no significant heartburn unless he eats very spicy food. He takes Prilosec but not on a daily basis as insurance doesn't pay for PPI. Patient comes in today because he recently "got choked" on an allergy pill. Though patient doesn't feels his chronic dysphagia has gotten worse, his wife states it has and describes how the patient uses different head/neck maneuvers to facilitate passage of food. Patient is eating small bites of food to avoid impaction. His last esophageal dilation was in 2005, it did help with swallowing and patient feels he may be in need of another dilation.  No other gastrointestinal complaints  Current Medications, Allergies, Past Medical History, Past Surgical History, Family History and Social History were reviewed in [employer] record.   PHYSICAL EXAMINATION : General: Well developed white male in no acute distress Head: Normocephalic and atraumatic Eyes:  sclerae anicteric,conjunctive pink. Ears: Normal auditory acuity Neck: Supple, no masses.  Lungs: Clear throughout to auscultation Heart: Regular rate and rhythm; no murmurs heard Abdomen: Soft, nondistended, nontender. No masses or hepatomegaly noted. Normal bowel sounds Rectal: Not done Musculoskeletal: Symmetrical with no gross deformities  Skin: No lesions on visible extremities Extremities: No edema or deformities noted Neurological: Oriented, grossly nonfocal Cervical Nodes:  No significant cervical adenopathy Psychological:  Alert and cooperative. Normal mood and affect  ASSESSMENT AND PLAN :  1. Solid food dysphagia with history of recurrent proximal esophageal strictures requiring dilation. Suspect recurrent stricture. His last  esophageal dilation was 2005. He needs repeat upper endoscopy with esophageal dilation. The benefits, risks, and potential complications of EGD with possible biopsies and/or dilation were discussed with the patient and he agrees to proceed. Procedure will be done by Dr. Juanda Chance with propofol.  2. Possible history of esophageal perforation following dilation in 2005.

## 2011-09-21 ENCOUNTER — Telehealth: Payer: Self-pay | Admitting: *Deleted

## 2011-09-21 NOTE — Telephone Encounter (Signed)
Informed patient that I had an earlier appt open for his EGD PROPOFOL with Dr Juanda Chance.  Originally scheduled for 10-09-2011.  I offered him Wed 09-23-2011 at 2:30 PM.  He called his wife and called me right back and would like that appt.  I changed his appt to Wed 09-23-2011 at 2:30 PM.  Advised him to arrive at 1:30PM in the [facility] 4th floor.  I also reminded him to have no food after midnight and he can drink clear liquids, ( no red or purple) until 12:30 PM.  He thanked me for remembering he wanted a sooner appointment.

## 2011-09-23 ENCOUNTER — Ambulatory Visit (AMBULATORY_SURGERY_CENTER): Payer: BC Managed Care – PPO | Admitting: Internal Medicine

## 2011-09-23 ENCOUNTER — Encounter: Payer: Self-pay | Admitting: Internal Medicine

## 2011-09-23 VITALS — BP 139/99 | HR 76 | Temp 97.8°F | Resp 15 | Ht 72.0 in | Wt 209.0 lb

## 2011-09-23 DIAGNOSIS — K222 Esophageal obstruction: Secondary | ICD-10-CM

## 2011-09-23 DIAGNOSIS — K200 Eosinophilic esophagitis: Secondary | ICD-10-CM

## 2011-09-23 DIAGNOSIS — R1310 Dysphagia, unspecified: Secondary | ICD-10-CM

## 2011-09-23 MED ORDER — OMEPRAZOLE 20 MG PO CPDR: 20.0000 mg | DELAYED_RELEASE_CAPSULE | Freq: Two times a day (BID) | ORAL | Status: DC

## 2011-09-23 MED ORDER — SODIUM CHLORIDE 0.9 % IV SOLN: 500.0000 mL | INTRAVENOUS | Status: DC

## 2011-09-23 NOTE — Progress Notes (Signed)
No complaints noted in the recovery room. Maw  Patient did not experience any of the following events: a burn prior to discharge; a fall within the facility; wrong site/side/patient/procedure/implant event; or a hospital transfer or hospital admission upon discharge from the facility. (G8907) Patient did not have preoperative order for IV antibiotic SSI prophylaxis. (G8918)  

## 2011-09-23 NOTE — Op Note (Signed)
[facility] [address] [place], [place]  [address]  ENDOSCOPY PROCEDURE REPORT  PATIENT:  Chad [name], Chad [name]  MR#:  [id number] BIRTHDATE:  1960/11/14, 50 yrs. old  GENDER:  male  ENDOSCOPIST:  Hedwig Morton. Juanda Chance, MD Referred by:  PROCEDURE DATE:  09/23/2011 PROCEDURE:  EGD with biopsy, 43239, EGD with Savory dilation over a guidewire ASA CLASS:  Class II INDICATIONS:  dysphagia recent ER visit due to Claritin tab lodging in the esophagus hx of proximal and distal es. stricture, last dil 2005,  MEDICATIONS:   MAC sedation, administered by CRNA, propofol (Diprivan) 300 mg TOPICAL ANESTHETIC:  none  DESCRIPTION OF PROCEDURE:   After the risks benefits and alternatives of the procedure were thoroughly explained, informed consent was obtained.  The [facility] GIF-H180 [id number] endoscope was introduced through the mouth and advanced to the second portion of the duodenum, without limitations.  The instrument was slowly withdrawn as the mucosa was fully examined. <<PROCEDUREIMAGES>>  Esophagitis was found in the distal esophagus. With standard forceps, a biopsy was obtained and sent to pathology (see image5). A stricture was found in the proximal esophagus. 2 stricturers, miore severe stricture in proximal esophagus 20-25 cm, fibrotic, barely admitted 9 mm scopr, tors the mucosa, no deep tears. With standard forceps, a biopsy was obtained and sent to pathology (see image3, image4, and image1). r/o Barrett's, r/op eosinophillic Duodenitis was found (see image6).  Otherwise normal esophagus (see image7 and image8). strictures not dilated due to a tear caused by the scope snd due to stricture being markedly narrow Retroflexed views revealed no abnormalities.    The scope was then withdrawn from the patient and the procedure completed.  COMPLICATIONS:  None  ENDOSCOPIC IMPRESSION: 1) Esophagitis in the distal esophagus 2) Stricture in the proximal esophagus 3) Duodenitis 4) Otherwise  normal esophagus severe proximal es. stricture dilated with 9 mm upper endoscope  RECOMMENDATIONS: 1) Await biopsy results increase Prelosec to 20 mg po bid will need repeat dilationj in 4 weeks  REPEAT EXAM:  In 4 week(s) for.  ______________________________ Hedwig Morton. Juanda Chance, MD  CC:  n. eSIGNED:   Hedwig Morton. Cloteal Isaacson at 09/23/2011 03:28 PM  Obremski, Jonny Ruiz, [id number]

## 2011-09-23 NOTE — Patient Instructions (Signed)
See the dilatation diet for the rest of the day.  Increase your Prilosec 20mg  to 2 x per day.  Written rx given for the prilosec.  Will need to repeat the Upper Endoscopy in 4 weeks.  Previsit on the 3rd floor will be Friday, October 23, 2011 at 8:00 am.  Procedure visit will be on the 4th floor on thur. October 29, 2011 at 4:00 pm but arrive here at 3:00 pm.  Please call if any questions.  Resume your prior medications today.  YOU HAD AN ENDOSCOPIC PROCEDURE TODAY AT THE [facility]: Refer to the procedure report that was given to you for any specific questions about what was found during the examination.  If the procedure report does not answer your questions, please call your gastroenterologist to clarify.  If you requested that your care partner not be given the details of your procedure findings, then the procedure report has been included in a sealed envelope for you to review at your convenience later.  YOU SHOULD EXPECT: Some feelings of bloating in the abdomen. Passage of more gas than usual.  Walking can help get rid of the air that was put into your GI tract during the procedure and reduce the bloating. If you had a lower endoscopy (such as a colonoscopy or flexible sigmoidoscopy) you may notice spotting of blood in your stool or on the toilet paper. If you underwent a bowel prep for your procedure, then you may not have a normal bowel movement for a few days.  DIET:   Drink plenty of fluids but you should avoid alcoholic beverages for 24 hours.  See the dilatation and follow for the rest of the day.  ACTIVITY: Your care partner should take you home directly after the procedure.  You should plan to take it easy, moving slowly for the rest of the day.  You can resume normal activity the day after the procedure however you should NOT DRIVE or use heavy machinery for 24 hours (because of the sedation medicines used during the test).    SYMPTOMS TO REPORT IMMEDIATELY: A gastroenterologist  can be reached at any hour.  During normal business hours, 8:30 AM to 5:00 PM Monday through Friday, call [phone].  After hours and on weekends, please call the GI answering service at [phone] who will take a message and have the physician on call contact you.     Following upper endoscopy (EGD)  Vomiting of blood or coffee ground material  New chest pain or pain under the shoulder blades  Painful or persistently difficult swallowing  New shortness of breath  Fever of 100F or higher  Black, tarry-looking stools  FOLLOW UP: If any biopsies were taken you will be contacted by phone or by letter within the next 1-3 weeks.  Call your gastroenterologist if you have not heard about the biopsies in 3 weeks.  Our staff will call the home number listed on your records the next business day following your procedure to check on you and address any questions or concerns that you may have at that time regarding the information given to you following your procedure. This is a courtesy call and so if there is no answer at the home number and we have not heard from you through the emergency physician on call, we will assume that you have returned to your regular daily activities without incident.  SIGNATURES/CONFIDENTIALITY: You and/or your care partner have signed paperwork which will be entered into your  electronic medical record.  These signatures attest to the fact that that the information above on your After Visit Summary has been reviewed and is understood.  Full responsibility of the confidentiality of this discharge information lies with you and/or your care-partner.

## 2011-09-24 ENCOUNTER — Telehealth: Payer: Self-pay | Admitting: *Deleted

## 2011-09-24 ENCOUNTER — Encounter: Payer: Self-pay | Admitting: *Deleted

## 2011-09-24 NOTE — Telephone Encounter (Signed)
error 

## 2011-09-24 NOTE — Telephone Encounter (Signed)
No answer to first phone number. Second number given is number to business phone, no person identified to number. Did not leave message.

## 2011-09-29 ENCOUNTER — Encounter: Payer: Self-pay | Admitting: Internal Medicine

## 2011-10-09 ENCOUNTER — Encounter: Payer: BC Managed Care – PPO | Admitting: Internal Medicine

## 2011-10-23 ENCOUNTER — Ambulatory Visit (AMBULATORY_SURGERY_CENTER): Payer: BC Managed Care – PPO | Admitting: *Deleted

## 2011-10-23 ENCOUNTER — Encounter: Payer: Self-pay | Admitting: Internal Medicine

## 2011-10-23 VITALS — Ht 73.0 in | Wt 209.0 lb

## 2011-10-23 DIAGNOSIS — K222 Esophageal obstruction: Secondary | ICD-10-CM

## 2011-10-23 NOTE — Progress Notes (Signed)
Notified Leshia,CMA of patient request for prescription xanax  2 tabs TID before meals.

## 2011-10-26 ENCOUNTER — Telehealth: Payer: Self-pay | Admitting: *Deleted

## 2011-10-26 NOTE — Telephone Encounter (Signed)
Per Dr Juanda Chance, patient soul continue getting rx from his PCP. Patient advised.   Patient came in today for a pre visit and states that he is taking Xanax 0.5 two tablet by mouth three times a day last filled on 09/19/2011 #90 written by Dr Mitzi Hansen. He states that you have written this rx before for him but only for once a day, but I can not find any record of this. Would you like to write his Xanax rx or would you like him to call Dr Reuel Boom who has been giving him the rx?

## 2011-10-29 ENCOUNTER — Ambulatory Visit (AMBULATORY_SURGERY_CENTER): Payer: BC Managed Care – PPO | Admitting: Internal Medicine

## 2011-10-29 ENCOUNTER — Encounter: Payer: Self-pay | Admitting: *Deleted

## 2011-10-29 ENCOUNTER — Encounter: Payer: Self-pay | Admitting: Internal Medicine

## 2011-10-29 VITALS — BP 136/100 | HR 81 | Temp 96.7°F | Resp 17 | Ht 73.0 in | Wt 209.0 lb

## 2011-10-29 DIAGNOSIS — K200 Eosinophilic esophagitis: Secondary | ICD-10-CM

## 2011-10-29 DIAGNOSIS — K222 Esophageal obstruction: Secondary | ICD-10-CM

## 2011-10-29 DIAGNOSIS — R1319 Other dysphagia: Secondary | ICD-10-CM

## 2011-10-29 DIAGNOSIS — R1310 Dysphagia, unspecified: Secondary | ICD-10-CM

## 2011-10-29 MED ORDER — SODIUM CHLORIDE 0.9 % IV SOLN: 500.0000 mL | INTRAVENOUS | Status: DC

## 2011-10-29 NOTE — Progress Notes (Signed)
Patient did not have preoperative order for IV antibiotic SSI prophylaxis. (G8918)  Patient did not experience any of the following events: a burn prior to discharge; a fall within the facility; wrong site/side/patient/procedure/implant event; or a hospital transfer or hospital admission upon discharge from the facility. (G8907)  

## 2011-10-29 NOTE — Patient Instructions (Signed)
YOU HAD AN ENDOSCOPIC PROCEDURE TODAY AT THE  ENDOSCOPY CENTER: Refer to the procedure report that was given to you for any specific questions about what was found during the examination.  If the procedure report does not answer your questions, please call your gastroenterologist to clarify.  If you requested that your care partner not be given the details of your procedure findings, then the procedure report has been included in a sealed envelope for you to review at your convenience later.  YOU SHOULD EXPECT: Some feelings of bloating in the abdomen. Passage of more gas than usual.  Walking can help get rid of the air that was put into your GI tract during the procedure and reduce the bloating. If you had a lower endoscopy (such as a colonoscopy or flexible sigmoidoscopy) you may notice spotting of blood in your stool or on the toilet paper. If you underwent a bowel prep for your procedure, then you may not have a normal bowel movement for a few days.  DIET: Your first meal following the procedure should be a light meal and then it is ok to progress to your normal diet.  A half-sandwich or bowl of soup is an example of a good first meal.  Heavy or fried foods are harder to digest and may make you feel nauseous or bloated.  Likewise meals heavy in dairy and vegetables can cause extra gas to form and this can also increase the bloating.  Drink plenty of fluids but you should avoid alcoholic beverages for 24 hours.  ACTIVITY: Your care partner should take you home directly after the procedure.  You should plan to take it easy, moving slowly for the rest of the day.  You can resume normal activity the day after the procedure however you should NOT DRIVE or use heavy machinery for 24 hours (because of the sedation medicines used during the test).    SYMPTOMS TO REPORT IMMEDIATELY: A gastroenterologist can be reached at any hour.  During normal business hours, 8:30 AM to 5:00 PM Monday through Friday,  call [phone].  After hours and on weekends, please call the GI answering service at [phone] who will take a message and have the physician on call contact you.  Following upper endoscopy (EGD)  Vomiting of blood or coffee ground material  New chest pain or pain under the shoulder blades  Painful or persistently difficult swallowing  New shortness of breath  Fever of 100F or higher  Black, tarry-looking stools  FOLLOW UP: If any biopsies were taken you will be contacted by phone or by letter within the next 1-3 weeks.  Call your gastroenterologist if you have not heard about the biopsies in 3 weeks.  Our staff will call the home number listed on your records the next business day following your procedure to check on you and address any questions or concerns that you may have at that time regarding the information given to you following your procedure. This is a courtesy call and so if there is no answer at the home number and we have not heard from you through the emergency physician on call, we will assume that you have returned to your regular daily activities without incident.  SIGNATURES/CONFIDENTIALITY: You and/or your care partner have signed paperwork which will be entered into your electronic medical record.  These signatures attest to the fact that that the information above on your After Visit Summary has been reviewed and is understood.  Full responsibility of   the confidentiality of this discharge information lies with you and/or your care-partner. 

## 2011-10-29 NOTE — Op Note (Signed)
[facility] [address] [place], [place]  [address]  ENDOSCOPY PROCEDURE REPORT  PATIENT:  [name], Chad  MR#:  [id number] BIRTHDATE:  [birth year], 50 yrs. old  GENDER:  male  ENDOSCOPIST:  Hedwig Morton. Juanda Chance, MD Referred by:  PROCEDURE DATE:  10/29/2011 PROCEDURE:  EGD with dilatation over guidewire ASA CLASS:  Class II INDICATIONS:  dysphagia hx es.stricture, last dil 4 weeks ago showed eosinopilic esophagitis, he was dilated only to 9 mm due to severity of the stricture and proximal tear  MEDICATIONS:   MAC sedation, administered by CRNA, propofol (Diprivan) 380 mg TOPICAL ANESTHETIC:  none  DESCRIPTION OF PROCEDURE:   After the risks benefits and alternatives of the procedure were thoroughly explained, informed consent was obtained.  The LB GIF-H180 [id number] endoscope was introduced through the mouth and advanced to the stomach antrum, without limitations.  The instrument was slowly withdrawn as the mucosa was fully examined. <<PROCEDUREIMAGES>>  A stricture was found. mild proximal es (see image1, image2, image3, and image4). stricture much improved since last EGD 4 weeks ago, no inflammation, 9 mm scope passed without resistance Savary dilation over a guidewire 12,13,14,dilators passed over the guidewire, there was no blood on the dilator    Retroflexed views revealed not done.    The scope was then withdrawn from the patient and the procedure completed.  COMPLICATIONS:  None  ENDOSCOPIC IMPRESSION: 1) Stricture 2) Not done s/p dilation to 52 F with Savary dil complete resolution of recent edophagitis RECOMMENDATIONS: continue PPI indefinitely, will redilate in 6 months may advance to solid food OV 6 weeks  REPEAT EXAM:  In 6 month(s) for.  ______________________________ Hedwig Morton. Juanda Chance, MD  CC:  n. eSIGNED:   Hedwig Morton. Garcia Dalzell at 10/29/2011 04:54 PM  Hickmon, Jonny Ruiz, [id number]

## 2011-10-30 ENCOUNTER — Telehealth: Payer: Self-pay | Admitting: *Deleted

## 2011-10-30 NOTE — Telephone Encounter (Signed)
No answer, or machine to receive message. Tried both numbers.

## 2011-11-23 ENCOUNTER — Encounter: Payer: Self-pay | Admitting: *Deleted

## 2011-12-11 ENCOUNTER — Encounter: Payer: Self-pay | Admitting: Internal Medicine

## 2011-12-11 ENCOUNTER — Ambulatory Visit (INDEPENDENT_AMBULATORY_CARE_PROVIDER_SITE_OTHER): Payer: BC Managed Care – PPO | Admitting: Internal Medicine

## 2011-12-11 VITALS — BP 144/82 | HR 76 | Ht 73.0 in | Wt 210.0 lb

## 2011-12-11 DIAGNOSIS — K200 Eosinophilic esophagitis: Secondary | ICD-10-CM

## 2011-12-11 DIAGNOSIS — K219 Gastro-esophageal reflux disease without esophagitis: Secondary | ICD-10-CM

## 2011-12-11 DIAGNOSIS — K222 Esophageal obstruction: Secondary | ICD-10-CM

## 2011-12-11 NOTE — Patient Instructions (Addendum)
You will be due for a recall endoscopy with dilation in 05/2012. We will send you a reminder in the mail when it gets closer to that time. CC: Dr Mitzi Hansen

## 2011-12-11 NOTE — Progress Notes (Signed)
Chad [name] Nov 30, 1960 MRN [id number]   History of Present Illness:  This is a 51 year old white male with eosinophilic esophagitis and a proximal esophageal stricture requiring periodic dilatation. He was dilated in March 2013 and again on 10/29/2011 up to 14 mm. He is currently asymptomatic. He is able to eat pizza and other types of solid food. He denies any chest pain or odynophagia. He has been on Prilosec 20 mg daily. He denies hoarseness, coughing or chest pain.   Past Medical History  Diagnosis Date  . 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 Anxiety   . Stricture and stenosis of esophagus   . Abnormal liver function test   . Esophagitis   . Duodenitis    No past surgical history on file.  reports that he has quit smoking. His smoking use included Cigarettes. He has never used smokeless tobacco. He reports that he drinks alcohol. He reports that he does not use illicit drugs. family history includes Ulcers in his father.  There is no history of Colon cancer. No Known Allergies      Review of Systems: As above  The remainder of the 10 point ROS is negative except as outlined in H&P   Physical Exam: General appearance  Well developed, in no distress. Neurological alert and oriented x 3. Psychological normal mood and affect.  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 He responded to his second dilation. The next dilation will be planned for the first week in November 2013. In the meantime, he will continue antireflux measures and Prilosec 20 mg daily. When he meets his deductible, he will be able to get AcipHex 20 mg daily.   12/11/2011 Lina Sar

## 2012-10-31 ENCOUNTER — Encounter: Payer: Self-pay | Admitting: Internal Medicine

## 2014-07-11 ENCOUNTER — Emergency Department (HOSPITAL_COMMUNITY)
Admission: EM | Admit: 2014-07-11 | Discharge: 2014-07-12 | Disposition: A | Discharge status: Home / Self Care | Payer: Self-pay | Attending: Emergency Medicine | Admitting: Emergency Medicine

## 2014-07-11 ENCOUNTER — Encounter (HOSPITAL_COMMUNITY): Payer: Self-pay

## 2014-07-11 ENCOUNTER — Telehealth: Payer: Self-pay | Admitting: Internal Medicine

## 2014-07-11 ENCOUNTER — Emergency Department (HOSPITAL_COMMUNITY): Payer: Self-pay

## 2014-07-11 DIAGNOSIS — F419 Anxiety disorder, unspecified: Secondary | ICD-10-CM | POA: Insufficient documentation

## 2014-07-11 DIAGNOSIS — K5792 Diverticulitis of intestine, part unspecified, without perforation or abscess without bleeding: Secondary | ICD-10-CM

## 2014-07-11 DIAGNOSIS — Z87891 Personal history of nicotine dependence: Secondary | ICD-10-CM | POA: Insufficient documentation

## 2014-07-11 DIAGNOSIS — K5790 Diverticulosis of intestine, part unspecified, without perforation or abscess without bleeding: Secondary | ICD-10-CM | POA: Insufficient documentation

## 2014-07-11 DIAGNOSIS — Z79899 Other long term (current) drug therapy: Secondary | ICD-10-CM | POA: Insufficient documentation

## 2014-07-11 DIAGNOSIS — Z792 Long term (current) use of antibiotics: Secondary | ICD-10-CM | POA: Insufficient documentation

## 2014-07-11 DIAGNOSIS — R1032 Left lower quadrant pain: Secondary | ICD-10-CM

## 2014-07-11 LAB — CBC WITH DIFFERENTIAL/PLATELET
BASOS ABS: 0 10*3/uL (ref 0.0–0.1)
BASOS PCT: 0 % (ref 0–1)
EOS PCT: 1 % (ref 0–5)
Eosinophils Absolute: 0.1 10*3/uL (ref 0.0–0.7)
HCT: 45.2 % (ref 39.0–52.0)
Hemoglobin: 15.7 g/dL (ref 13.0–17.0)
Lymphocytes Relative: 10 % — ABNORMAL LOW (ref 12–46)
Lymphs Abs: 1.4 10*3/uL (ref 0.7–4.0)
MCH: 30.5 pg (ref 26.0–34.0)
MCHC: 34.7 g/dL (ref 30.0–36.0)
MCV: 87.8 fL (ref 78.0–100.0)
MONO ABS: 1.3 10*3/uL — AB (ref 0.1–1.0)
Monocytes Relative: 9 % (ref 3–12)
Neutro Abs: 11.7 10*3/uL — ABNORMAL HIGH (ref 1.7–7.7)
Neutrophils Relative %: 80 % — ABNORMAL HIGH (ref 43–77)
Platelets: 192 10*3/uL (ref 150–400)
RBC: 5.15 MIL/uL (ref 4.22–5.81)
RDW: 13 % (ref 11.5–15.5)
WBC: 14.5 10*3/uL — AB (ref 4.0–10.5)

## 2014-07-11 LAB — COMPREHENSIVE METABOLIC PANEL
ALT: 24 U/L (ref 0–53)
AST: 23 U/L (ref 0–37)
Albumin: 4.3 g/dL (ref 3.5–5.2)
Alkaline Phosphatase: 85 U/L (ref 39–117)
Anion gap: 10 (ref 5–15)
BILIRUBIN TOTAL: 1.9 mg/dL — AB (ref 0.3–1.2)
BUN: 11 mg/dL (ref 6–23)
CO2: 24 mmol/L (ref 19–32)
Calcium: 9.2 mg/dL (ref 8.4–10.5)
Chloride: 97 mEq/L (ref 96–112)
Creatinine, Ser: 1.07 mg/dL (ref 0.50–1.35)
GFR calc non Af Amer: 79 mL/min — ABNORMAL LOW (ref >90)
Glucose, Bld: 109 mg/dL — ABNORMAL HIGH (ref 70–99)
Potassium: 3.5 mmol/L (ref 3.5–5.1)
Sodium: 131 mmol/L — ABNORMAL LOW (ref 135–145)
TOTAL PROTEIN: 8.2 g/dL (ref 6.0–8.3)

## 2014-07-11 LAB — POC OCCULT BLOOD, ED: Fecal Occult Bld: POSITIVE — AB

## 2014-07-11 MED ORDER — HYDROMORPHONE HCL 1 MG/ML IJ SOLN
1.0000 mg | Freq: Once | INTRAMUSCULAR | Status: AC
  Administered 2014-07-11: 1 mg via INTRAVENOUS
  Filled 2014-07-11: qty 1

## 2014-07-11 MED ORDER — HYDROCODONE-ACETAMINOPHEN 5-325 MG PO TABS: 1.0000 | ORAL_TABLET | Freq: Four times a day (QID) | ORAL | Status: AC | PRN

## 2014-07-11 MED ORDER — CIPROFLOXACIN HCL 500 MG PO TABS: 500.0000 mg | ORAL_TABLET | Freq: Two times a day (BID) | ORAL | Status: DC

## 2014-07-11 MED ORDER — ONDANSETRON HCL 4 MG/2ML IJ SOLN
4.0000 mg | Freq: Once | INTRAMUSCULAR | Status: AC
  Administered 2014-07-11: 4 mg via INTRAVENOUS
  Filled 2014-07-11: qty 2

## 2014-07-11 MED ORDER — SODIUM CHLORIDE 0.9 % IV BOLUS (SEPSIS)
1000.0000 mL | Freq: Once | INTRAVENOUS | Status: AC
  Administered 2014-07-11: 1000 mL via INTRAVENOUS

## 2014-07-11 MED ORDER — METRONIDAZOLE 500 MG PO TABS: 500.0000 mg | ORAL_TABLET | Freq: Four times a day (QID) | ORAL | Status: DC

## 2014-07-11 MED ORDER — METRONIDAZOLE IN NACL 5-0.79 MG/ML-% IV SOLN
500.0000 mg | Freq: Once | INTRAVENOUS | Status: AC
  Administered 2014-07-12: 500 mg via INTRAVENOUS
  Filled 2014-07-11: qty 100

## 2014-07-11 MED ORDER — IOHEXOL 300 MG/ML  SOLN
100.0000 mL | Freq: Once | INTRAMUSCULAR | Status: AC | PRN
  Administered 2014-07-11: 100 mL via INTRAVENOUS

## 2014-07-11 MED ORDER — CIPROFLOXACIN IN D5W 400 MG/200ML IV SOLN
400.0000 mg | Freq: Once | INTRAVENOUS | Status: AC
  Administered 2014-07-11: 400 mg via INTRAVENOUS
  Filled 2014-07-11: qty 200

## 2014-07-11 NOTE — Telephone Encounter (Signed)
Spoke with patient and he states he has had bright, red blood without stool x 3 days, abdominal pain in left lower side and unable to eat. He reports fever yesterday and he took Tylenol. Instructed patient to go to ER now. [place] beach and [place] 2 [place] away. Suggested he go to the next ED on road. He states he has dogs in the car and wants to get to [place]. Strongly encouraged patient to stop at next ED.

## 2014-07-11 NOTE — ED Provider Notes (Signed)
CSN: [id number]     Arrival date & time 07/11/14  1935 History   First MD Initiated Contact with Patient 07/11/14 2017     Chief Complaint  Patient presents with  . Diverticulitis     (Consider location/radiation/quality/duration/timing/severity/associated sxs/prior Treatment) The history is provided by the patient and the spouse.  pt w hx diverticula, c/o llq pain in the past 3-4 days. Pain dull, constant, mod-severe, worse w palpation. No radiation. No back or flank pain. +decreased appetite and subjective fever.  States small amt brbpr. No faintness or dizziness. No abd distension. No vomiting. Denies dysuria or gu c/o. Denies prior abd surgery.     Past Medical History  Diagnosis Date  . 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 Anxiety   . Stricture and stenosis of esophagus   . Abnormal liver function test   . Esophagitis   . Duodenitis    History reviewed. No pertinent past surgical history. Family History  Problem Relation Age of Onset  . Colon cancer Neg Hx   . Ulcers Father    History  Substance Use Topics  . Smoking status: Former Smoker    Types: Cigarettes  . Smokeless tobacco: Never Used  . Alcohol Use: Yes     Comment: Rarely    Review of Systems  Constitutional: Positive for fever. Negative for chills.  HENT: Negative for sore throat.   Eyes: Negative for redness.  Respiratory: Negative for shortness of breath.   Cardiovascular: Negative for chest pain.  Gastrointestinal: Positive for nausea. Negative for vomiting, abdominal pain and diarrhea.  Endocrine: Negative for polyuria.  Genitourinary: Negative for dysuria and flank pain.  Musculoskeletal: Negative for back pain and neck pain.  Skin: Negative for rash.  Neurological: Negative for headaches.  Hematological: Does not bruise/bleed easily.  Psychiatric/Behavioral: Negative for confusion.      Allergies  Review of patient's allergies indicates no known allergies.  Home Medications   Prior  to Admission medications   Medication Sig Start Date End Date Taking? Authorizing Provider  ALPRAZolam Duanne Moron) 0.5 MG tablet Take 0.5 mg by mouth 3 (three) times daily before meals. Takes two pills 30 mins. before meals    Historical Provider, MD  hydrochlorothiazide (HYDRODIURIL) 25 MG tablet One tablet by mouth once daily 11/18/11   Historical Provider, MD  loratadine (CLARITIN REDITABS) 10 MG dissolvable tablet Take 10 mg by mouth daily.    Historical Provider, MD  minocycline (MINOCIN,DYNACIN) 100 MG capsule Take 100 mg by mouth 2 (two) times daily. Take 1 tab onoce weekly    Historical Provider, MD  oxyCODONE-acetaminophen (PERCOCET) 5-325 MG per tablet Take 1 tablet by mouth as needed. 09/29/11   Historical Provider, MD   BP 132/98 mmHg  Pulse 116  Temp(Src) 98.8 F (37.1 C) (Tympanic)  Resp 18  Ht 6' (1.829 m)  Wt 200 lb (90.719 kg)  BMI 27.12 kg/m2  SpO2 100% Physical Exam  Constitutional: He is oriented to person, place, and time. He appears well-developed and well-nourished. No distress.  HENT:  Head: Atraumatic.  Mouth/Throat: Oropharynx is clear and moist.  Eyes: Conjunctivae are normal. No scleral icterus.  Neck: Neck supple. No tracheal deviation present.  Cardiovascular: Normal rate, regular rhythm, normal heart sounds and intact distal pulses.   Pulmonary/Chest: Effort normal and breath sounds normal. No accessory muscle usage. No respiratory distress.  Abdominal: Soft. Bowel sounds are normal. He exhibits no distension and no mass. There is tenderness. There is no rebound and no guarding.  Moderate LLQ tenderness, no rebound or guarding.   Genitourinary:  No cva tenderness  Musculoskeletal: Normal range of motion.  Neurological: He is alert and oriented to person, place, and time.  Skin: Skin is warm and dry. He is not diaphoretic.  Psychiatric: He has a normal mood and affect.  Nursing note and vitals reviewed.   ED Course  Procedures (including critical care  time) Labs Review   Results for orders placed or performed during the hospital encounter of 07/11/14  CBC with Differential  Result Value Ref Range   WBC 14.5 (H) 4.0 - 10.5 K/uL   RBC 5.15 4.22 - 5.81 MIL/uL   Hemoglobin 15.7 13.0 - 17.0 g/dL   HCT 45.2 39.0 - 52.0 %   MCV 87.8 78.0 - 100.0 fL   MCH 30.5 26.0 - 34.0 pg   MCHC 34.7 30.0 - 36.0 g/dL   RDW 13.0 11.5 - 15.5 %   Platelets 192 150 - 400 K/uL   Neutrophils Relative % 80 (H) 43 - 77 %   Neutro Abs 11.7 (H) 1.7 - 7.7 K/uL   Lymphocytes Relative 10 (L) 12 - 46 %   Lymphs Abs 1.4 0.7 - 4.0 K/uL   Monocytes Relative 9 3 - 12 %   Monocytes Absolute 1.3 (H) 0.1 - 1.0 K/uL   Eosinophils Relative 1 0 - 5 %   Eosinophils Absolute 0.1 0.0 - 0.7 K/uL   Basophils Relative 0 0 - 1 %   Basophils Absolute 0.0 0.0 - 0.1 K/uL  Comprehensive metabolic panel  Result Value Ref Range   Sodium 131 (L) 135 - 145 mmol/L   Potassium 3.5 3.5 - 5.1 mmol/L   Chloride 97 96 - 112 mEq/L   CO2 24 19 - 32 mmol/L   Glucose, Bld 109 (H) 70 - 99 mg/dL   BUN 11 6 - 23 mg/dL   Creatinine, Ser 1.07 0.50 - 1.35 mg/dL   Calcium 9.2 8.4 - 10.5 mg/dL   Total Protein 8.2 6.0 - 8.3 g/dL   Albumin 4.3 3.5 - 5.2 g/dL   AST 23 0 - 37 U/L   ALT 24 0 - 53 U/L   Alkaline Phosphatase 85 39 - 117 U/L   Total Bilirubin 1.9 (H) 0.3 - 1.2 mg/dL   GFR calc non Af Amer 79 (L) >90 mL/min   GFR calc Af Amer >90 >90 mL/min   Anion gap 10 5 - 15  POC occult blood, ED  Result Value Ref Range   Fecal Occult Bld POSITIVE (A) NEGATIVE   Ct Abdomen Pelvis W Contrast  07/11/2014   CLINICAL DATA:  Acute onset of bright red blood per rectum for 3 days. Left lower quadrant abdominal pain. Initial encounter.  EXAM: CT ABDOMEN AND PELVIS WITH CONTRAST  TECHNIQUE: Multidetector CT imaging of the abdomen and pelvis was performed using the standard protocol following bolus administration of intravenous contrast.  CONTRAST:  14mL OMNIPAQUE IOHEXOL 300 MG/ML  SOLN  COMPARISON:  CT  of the abdomen and pelvis from 05/07/2010  FINDINGS: The visualized lung bases are clear.  The liver and spleen are unremarkable in appearance. The gallbladder is within normal limits. The pancreas and adrenal glands are unremarkable.  The kidneys are unremarkable in appearance. There is no evidence of hydronephrosis. No renal or ureteral stones are seen. No perinephric stranding is appreciated.  Transient intussusception of the small bowel at the right mid abdomen is noted on delayed images, within normal limits. The small bowel is  unremarkable in appearance. The stomach is within normal limits. No acute vascular abnormalities are seen. Minimal calcification is seen along the common iliac arteries bilaterally.  The appendix is normal in caliber, without evidence for appendicitis.  There is focal wall thickening at the proximal sigmoid colon, associated inflamed diverticula and a small amount of associated free fluid. Surrounding soft tissue inflammation is seen. Findings are compatible with acute diverticulitis. There is no evidence of perforation or abscess formation at this time.  Scattered diverticulosis is noted along the descending and proximal sigmoid colon.  The bladder is mildly distended and grossly unremarkable. The prostate is normal in size. Mildly improved scattered calcification is noted within the penile shaft, of uncertain significance. No inguinal lymphadenopathy is seen.  No acute osseous abnormalities are identified.  IMPRESSION: 1. Acute diverticulitis at the proximal sigmoid colon, with focal wall thickening, soft tissue inflammation and associated free fluid. No definite evidence of perforation or abscess formation at this time. 2. Scattered diverticulosis involves the descending and proximal sigmoid colon.   Electronically Signed   By: Garald Balding M.D.   On: 07/11/2014 22:17      MDM   Iv ns bolus. Dilaudid 1 mg iv. zofran iv.   Labs.  Ct.  Reviewed nursing notes and prior  charts for additional history.   Ct c/w diverticulitis.   Recheck pts pain improved. No peritoneal signs.  cipro and flagyl iv.  Patient currently appears stable for d/c.  Return precautions discussed.  Pt has gi md w whom he can follow up.    Mirna Mires, MD 07/11/14 2250

## 2014-07-11 NOTE — ED Notes (Signed)
Pt took tylenol all the [place] from the beach today, pain before that was 10/10

## 2014-07-11 NOTE — Discharge Instructions (Signed)
It was our pleasure to provide your ER care today - we hope that you feel better.  Drink plenty of fluids. Clear liquid diet for the next day, then advance as tolerating.  Take cipro and flagyl (antibiotics) as prescribed.  You may take hydrocodone as need for pain. No driving when taking hydrocodone. Also, do not take tylenol or acetaminophen containing medication when taking hydrocodone.  Follow up with your doctor in the next few days.  Return to ER if worse, new symptoms, severe or intractable pain, persistent vomiting, weak/fainting, other concern.  You were given pain medication in the ER - no driving for the next 4 hours.       Diverticulitis Diverticulitis is inflammation or infection of small pouches in your colon that form when you have a condition called diverticulosis. The pouches in your colon are called diverticula. Your colon, or large intestine, is where water is absorbed and stool is formed. Complications of diverticulitis can include:  Bleeding.  Severe infection.  Severe pain.  Perforation of your colon.  Obstruction of your colon. CAUSES  Diverticulitis is caused by bacteria. Diverticulitis happens when stool becomes trapped in diverticula. This allows bacteria to grow in the diverticula, which can lead to inflammation and infection. RISK FACTORS People with diverticulosis are at risk for diverticulitis. Eating a diet that does not include enough fiber from fruits and vegetables may make diverticulitis more likely to develop. SYMPTOMS  Symptoms of diverticulitis may include:  Abdominal pain and tenderness. The pain is normally located on the left side of the abdomen, but may occur in other areas.  Fever and chills.  Bloating.  Cramping.  Nausea.  Vomiting.  Constipation.  Diarrhea.  Blood in your stool. DIAGNOSIS  Your health care provider will ask you about your medical history and do a physical exam. You may need to have tests done  because many medical conditions can cause the same symptoms as diverticulitis. Tests may include:  Blood tests.  Urine tests.  Imaging tests of the abdomen, including X-rays and CT scans. When your condition is under control, your health care provider may recommend that you have a colonoscopy. A colonoscopy can show how severe your diverticula are and whether something else is causing your symptoms. TREATMENT  Most cases of diverticulitis are mild and can be treated at home. Treatment may include:  Taking over-the-counter pain medicines.  Following a clear liquid diet.  Taking antibiotic medicines by mouth for 7-10 days. More severe cases may be treated at a hospital. Treatment may include:  Not eating or drinking.  Taking prescription pain medicine.  Receiving antibiotic medicines through an IV tube.  Receiving fluids and nutrition through an IV tube.  Surgery. HOME CARE INSTRUCTIONS   Follow your health care provider's instructions carefully.  Follow a full liquid diet or other diet as directed by your health care provider. After your symptoms improve, your health care provider may tell you to change your diet. He or she may recommend you eat a high-fiber diet. Fruits and vegetables are good sources of fiber. Fiber makes it easier to pass stool.  Take fiber supplements or probiotics as directed by your health care provider.  Only take medicines as directed by your health care provider.  Keep all your follow-up appointments. SEEK MEDICAL CARE IF:   Your pain does not improve.  You have a hard time eating food.  Your bowel movements do not return to normal. SEEK IMMEDIATE MEDICAL CARE IF:   Your pain becomes  worse.  Your symptoms do not get better.  Your symptoms suddenly get worse.  You have a fever.  You have repeated vomiting.  You have bloody or black, tarry stools. MAKE SURE YOU:   Understand these instructions.  Will watch your condition.  Will  get help right away if you are not doing well or get worse. Document Released: 04/01/2005 Document Revised: 06/27/2013 Document Reviewed: 05/17/2013 [facility] Patient Information 2015 [place], [place]. This information is not intended to replace advice given to you by your health care provider. Make sure you discuss any questions you have with your health care provider.

## 2014-07-11 NOTE — Telephone Encounter (Signed)
I tried to call him on the mobile and home phone, has a rtecording, left message to call in am and we will try to accommodate him if he does nor  Go to ED tonight.

## 2014-07-11 NOTE — ED Notes (Signed)
Pt has a hx of diverticulitis, he states the pain is the same since Monday, he also complains of bright red blood in the toilet for two days

## 2014-07-12 NOTE — Telephone Encounter (Signed)
Chad [name], I talked to Chad [name] last night, he was at [facility] ED, CT scan showed diverticulitis and he was sent home on Cipro and Flagyl. ICan you, please call him and See if I could see him on Tue 1/12 in the office. He needs to stay on clear liquids next 24 hours and call if he gets worse. Thanx        Patient given recommendations. Scheduled on 07/17/14 at 8:45 AM with Dr. Olevia Perches.

## 2014-07-17 ENCOUNTER — Other Ambulatory Visit (INDEPENDENT_AMBULATORY_CARE_PROVIDER_SITE_OTHER): Payer: BLUE CROSS/BLUE SHIELD

## 2014-07-17 ENCOUNTER — Encounter: Payer: Self-pay | Admitting: Internal Medicine

## 2014-07-17 ENCOUNTER — Ambulatory Visit (INDEPENDENT_AMBULATORY_CARE_PROVIDER_SITE_OTHER): Payer: BLUE CROSS/BLUE SHIELD | Admitting: Internal Medicine

## 2014-07-17 VITALS — BP 128/80 | HR 76 | Ht 73.0 in | Wt 202.0 lb

## 2014-07-17 DIAGNOSIS — K222 Esophageal obstruction: Secondary | ICD-10-CM

## 2014-07-17 DIAGNOSIS — K625 Hemorrhage of anus and rectum: Secondary | ICD-10-CM

## 2014-07-17 DIAGNOSIS — K5732 Diverticulitis of large intestine without perforation or abscess without bleeding: Secondary | ICD-10-CM

## 2014-07-17 LAB — SEDIMENTATION RATE: SED RATE: 36 mm/h — AB (ref 0–22)

## 2014-07-17 LAB — CBC WITH DIFFERENTIAL/PLATELET
Basophils Absolute: 0 10*3/uL (ref 0.0–0.1)
Basophils Relative: 0.3 % (ref 0.0–3.0)
EOS ABS: 0.3 10*3/uL (ref 0.0–0.7)
EOS PCT: 4 % (ref 0.0–5.0)
HCT: 43.7 % (ref 39.0–52.0)
Hemoglobin: 14.3 g/dL (ref 13.0–17.0)
LYMPHS ABS: 1.7 10*3/uL (ref 0.7–4.0)
Lymphocytes Relative: 20.4 % (ref 12.0–46.0)
MCHC: 32.7 g/dL (ref 30.0–36.0)
MCV: 89.4 fl (ref 78.0–100.0)
MONOS PCT: 7.9 % (ref 3.0–12.0)
Monocytes Absolute: 0.7 10*3/uL (ref 0.1–1.0)
NEUTROS PCT: 67.4 % (ref 43.0–77.0)
Neutro Abs: 5.5 10*3/uL (ref 1.4–7.7)
PLATELETS: 268 10*3/uL (ref 150.0–400.0)
RBC: 4.89 Mil/uL (ref 4.22–5.81)
RDW: 13.4 % (ref 11.5–15.5)
WBC: 8.2 10*3/uL (ref 4.0–10.5)

## 2014-07-17 MED ORDER — PEG-KCL-NACL-NASULF-NA ASC-C 100 G PO SOLR: 1.0000 | Freq: Once | ORAL | Status: DC

## 2014-07-17 MED ORDER — AMOXICILLIN-POT CLAVULANATE 600-42.9 MG/5ML PO SUSR: 600.0000 mg | Freq: Two times a day (BID) | ORAL | Status: DC

## 2014-07-17 NOTE — Progress Notes (Signed)
Chad [name] Dec 27, 1960 [id number]  Note: This dictation was prepared with Dragon digital system. Any transcriptional errors that result from this procedure are unintentional.   History of Present Illness: This is a 54 year old white male with acute left lower quadrant abdominal pain which started [place] the beach. He drove  to [facility] for 4 hours to [facility] ED where he was evaluated and diagnosed with acute diverticulitis. White blood cell count was 14,000. CT scan showed inflammatory process in the left lower quadrant consistent with diverticulitis. He had low-grade temperature. He was started on Flagyl and Cipro but has not taken it regularly because he has history of esophageal stricture and severe dysphagia and could not swallow the pills. Nevertheless he is much better about 75%. Rectal bleeding has stopped and he has had several small bowel movements. We have seen patient in the past for his eosinophilic esophagitis. He had  esophageal stricture dilated in 2001, 2004, 2005 and in March 2013. He has been on Omeprazole.. Patient had 2 prior episodes of diverticulitis but  this one seems to be the worst one so far. There is no family history of colon cancer    Past Medical History  Diagnosis Date  . 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 Anxiety   . Stricture and stenosis of esophagus   . Abnormal liver function test   . Esophagitis   . Duodenitis   . Diverticulitis   . Diverticulosis     History reviewed. No pertinent past surgical history.  No Known Allergies  Family history and social history have been reviewed.  Review of Systems: Solid food dysphagia. Denies weight loss. Positive for low volume rectal bleeding.  The remainder of the 10 point ROS is negative except as outlined in the H&P  Physical Exam: General Appearance Well developed, in no distress Eyes  Non icteric  HEENT  Non traumatic, normocephalic  Mouth No lesion, tongue papillated, no  cheilosis Neck Supple without adenopathy, thyroid not enlarged, no carotid bruits, no JVD Lungs Clear to auscultation bilaterally COR Normal S1, normal S2, regular rhythm, no murmur, quiet precordium Abdomen soft with normoactive bowel sounds. Mild tenderness on deep palpation in left lower quadrant. No mass or rebound Rectal small amount of soft Hemoccult negative stool Extremities  No pedal edema Skin No lesions Neurological Alert and oriented x 3 Psychological Normal mood and affect  Assessment and Plan:   54 year old white male with the third attack of diverticulitis. This one is the only one documented on CT scan. He has not been taking his antibiotics because of severe dysphagia and esophageal stricture. We will switch him to Augmentin liquid 600 mg  twice a day for 10 days. He will be scheduled for colonoscopy in 6 weeks. We will check his CBC and sedimentation rates today. He will stay on a low residue diet.  Esophageal stricture. Last dilatation 3 years ago. He has recurrent dysphagia to solids and pills He needs to be dilated again. This will be done at the time of the colonoscopy.    Delfin Edis 07/17/2014

## 2014-07-17 NOTE — Patient Instructions (Addendum)
Low-Fiber Diet (Low Residue Diet) Fiber is found in fruits, vegetables, and whole grains. A low-fiber diet restricts fibrous foods that are not digested in the small intestine. A diet containing about 10-15 grams of fiber per day is considered low fiber. Low-fiber diets may be used to:  Promote healing and rest the bowel during intestinal flare-ups.  Prevent blockage of a partially obstructed or narrowed gastrointestinal tract.  Reduce fecal weight and volume.  Slow the movement of feces. You may be on a low-fiber diet as a transitional diet following surgery, after an injury (trauma), or because of a short (acute) or lifelong (chronic) illness. Your health care provider will determine the length of time you need to stay on this diet.  WHAT DO I NEED TO KNOW ABOUT A LOW-FIBER DIET? Always check the fiber content on the packaging's Nutrition Facts label, especially on foods from the grains list. Ask your dietitian if you have questions about specific foods that are related to your condition, especially if the food is not listed below. In general, a low-fiber food will have less than 2 g of fiber. WHAT FOODS CAN I EAT? Grains All breads and crackers made with white flour. Sweet rolls, doughnuts, waffles, pancakes, [place] toast, bagels. Pretzels, Melba toast, zwieback. Well-cooked cereals, such as cornmeal, farina, or cream cereals. Dry cereals that do not contain whole grains, fruit, or nuts, such as refined corn, wheat, rice, and oat cereals. Potatoes prepared any way without skins, plain pastas and noodles, refined white rice. Use white flour for baking and making sauces. Use allowed list of grains for casseroles, dumplings, and puddings.  Vegetables Strained tomato and vegetable juices. Fresh lettuce, cucumber, spinach. Well-cooked (no skin or pulp) or canned vegetables, such as asparagus, bean sprouts, beets, carrots, green beans, mushrooms, potatoes, pumpkin, spinach, yellow squash, tomato  sauce/puree, turnips, yams, and zucchini. Keep servings limited to  cup.  Fruits All fruit juices except prune juice. Cooked or canned fruits without skin and seeds, such as applesauce, apricots, cherries, fruit cocktail, grapefruit, grapes, mandarin oranges, melons, peaches, pears, pineapple, and plums. Fresh fruits without skin, such as apricots, avocados, bananas, melons, pineapple, nectarines, and peaches. Keep servings limited to  cup or 1 piece.  Meat and Other Protein Sources Ground or well-cooked tender beef, ham, veal, lamb, pork, or poultry. Eggs, plain cheese. Fish, oysters, shrimp, lobster, and other seafood. Liver, organ meats. Smooth nut butters. Dairy All milk products and alternative dairy substitutes, such as soy, rice, almond, and coconut, not containing added whole nuts, seeds, or added fruit. Beverages Decaf coffee, fruit, and vegetable juices or smoothies (small amounts, with no pulp or skins, and with fruits from allowed list), sports drinks, herbal tea. Condiments Ketchup, mustard, vinegar, cream sauce, cheese sauce, cocoa powder. Spices in moderation, such as allspice, basil, bay leaves, celery powder or leaves, cinnamon, cumin powder, curry powder, ginger, mace, marjoram, onion or garlic powder, oregano, paprika, parsley flakes, ground pepper, rosemary, sage, savory, tarragon, thyme, and turmeric. Sweets and Desserts Plain cakes and cookies, pie made with allowed fruit, pudding, custard, cream pie. Gelatin, fruit, ice, sherbet, frozen ice pops. Ice cream, ice milk without nuts. Plain hard candy, honey, jelly, molasses, syrup, sugar, chocolate syrup, gumdrops, marshmallows. Limit overall sugar intake.  Fats and Oil Margarine, butter, cream, mayonnaise, salad oils, plain salad dressings made from allowed foods. Choose healthy fats such as olive oil, canola oil, and omega-3 fatty acids (such as found in salmon or tuna) when possible.  Other Bouillon, broth, or  cream soups  made from allowed foods. Any strained soup. Casseroles or mixed dishes made with allowed foods. The items listed above may not be a complete list of recommended foods or beverages. Contact your dietitian for more options.  WHAT FOODS ARE NOT RECOMMENDED? Grains All whole wheat and whole grain breads and crackers. Multigrains, rye, bran seeds, nuts, or coconut. Cereals containing whole grains, multigrains, bran, coconut, nuts, raisins. Cooked or dry oatmeal, steel-cut oats. Coarse wheat cereals, granola. Cereals advertised as high fiber. Potato skins. Whole grain pasta, wild or brown rice. Popcorn. Coconut flour. Bran, buckwheat, corn bread, multigrains, rye, wheat germ.  Vegetables Fresh, cooked or canned vegetables, such as artichokes, asparagus, beet greens, broccoli, Brussels sprouts, cabbage, celery, cauliflower, corn, eggplant, kale, legumes or beans, okra, peas, and tomatoes. Avoid large servings of any vegetables, especially raw vegetables.  Fruits Fresh fruits, such as apples with or without skin, berries, cherries, figs, grapes, grapefruit, guavas, kiwis, mangoes, oranges, papayas, pears, persimmons, pineapple, and pomegranate. Prune juice and juices with pulp, stewed or dried prunes. Dried fruits, dates, raisins. Fruit seeds or skins. Avoid large servings of all fresh fruits. Meats and Other Protein Sources Tough, fibrous meats with gristle. Chunky nut butter. Cheese made with seeds, nuts, or other foods not recommended. Nuts, seeds, legumes (beans, including baked beans), dried peas, beans, lentils.  Dairy Yogurt or cheese that contains nuts, seeds, or added fruit.  Beverages Fruit juices with high pulp, prune juice. Caffeinated coffee and teas.  Condiments Coconut, maple syrup, pickles, olives. Sweets and Desserts Desserts, cookies, or candies that contain nuts or coconut, chunky peanut butter, dried fruits. Jams, preserves with seeds, marmalade. Large amounts of sugar and sweets. Any  other dessert made with fruits from the not recommended list.  Other Soups made from vegetables that are not recommended or that contain other foods not recommended.  The items listed above may not be a complete list of foods and beverages to avoid. Contact your dietitian for more information. Document Released: 12/12/2001 Document Revised: 06/27/2013 Document Reviewed: 05/15/2013 [facility] Patient Information 2015 [place], [place]. This information is not intended to replace advice given to you by your health care provider. Make sure you discuss any questions you have with your health care provider.  Your prescriptions have been sent to your pharmacy Cc. Carolee Rota NP  You have been scheduled for an endoscopy and colonoscopy. Please follow the written instructions given to you at your visit today. Please pick up your prep at the pharmacy within the next 1-3 days. If you use inhalers (even only as needed), please bring them with you on the day of your procedure. Your physician has requested that you go to www.startemmi.com and enter the access code given to you at your visit today. This web site gives a general overview about your procedure. However, you should still follow specific instructions given to you by our office regarding your preparation for the procedure.  Go to the basement for labs today

## 2014-08-06 ENCOUNTER — Encounter: Payer: Self-pay | Admitting: Internal Medicine

## 2014-08-28 ENCOUNTER — Ambulatory Visit (AMBULATORY_SURGERY_CENTER): Payer: BLUE CROSS/BLUE SHIELD | Admitting: Internal Medicine

## 2014-08-28 ENCOUNTER — Encounter: Payer: Self-pay | Admitting: Internal Medicine

## 2014-08-28 VITALS — BP 135/91 | HR 69 | Temp 98.7°F | Resp 20 | Ht 73.0 in | Wt 202.0 lb

## 2014-08-28 DIAGNOSIS — K200 Eosinophilic esophagitis: Secondary | ICD-10-CM

## 2014-08-28 DIAGNOSIS — D125 Benign neoplasm of sigmoid colon: Secondary | ICD-10-CM

## 2014-08-28 DIAGNOSIS — K219 Gastro-esophageal reflux disease without esophagitis: Secondary | ICD-10-CM

## 2014-08-28 DIAGNOSIS — K2100 Gastro-esophageal reflux disease with esophagitis, without bleeding: Secondary | ICD-10-CM

## 2014-08-28 DIAGNOSIS — K625 Hemorrhage of anus and rectum: Secondary | ICD-10-CM

## 2014-08-28 DIAGNOSIS — K222 Esophageal obstruction: Secondary | ICD-10-CM

## 2014-08-28 MED ORDER — OMEPRAZOLE 40 MG PO CPDR
DELAYED_RELEASE_CAPSULE | ORAL | Status: AC
Start: 2014-08-28 — End: ?

## 2014-08-28 MED ORDER — SODIUM CHLORIDE 0.9 % IV SOLN: 500.0000 mL | INTRAVENOUS | Status: DC

## 2014-08-28 NOTE — Op Note (Signed)
[place]  [address] [place], [address]   COLONOSCOPY PROCEDURE REPORT  PATIENT: Chad, [name]  MR#: [id number] BIRTHDATE: [birth year] , 53  yrs. old GENDER: male ENDOSCOPIST: Lafayette Dragon, M REFERRED ZG:YFVCBS Daniel NP PROCEDURE DATE:  08/28/2014 PROCEDURE:   Colonoscopy with snare polypectomy First Screening Colonoscopy - Avg.  risk and is 50 yrs.  old or older Yes.  Prior Negative Screening - Now for repeat screening. N/A  History of Adenoma - Now for follow-up colonoscopy & has been > or = to 3 yrs.  N/A  Polyps Removed Today? Yes. ASA CLASS:   Class II INDICATIONS:recurrent episode of diverticulitis. MEDICATIONS: Monitored anesthesia care and Propofol 200 mg IV  DESCRIPTION OF PROCEDURE:   After the risks benefits and alternatives of the procedure were thoroughly explained, informed consent was obtained.  The digital rectal exam revealed no abnormalities of the rectum.   The LB [id number]  endoscope was introduced through the anus and advanced to the cecum, which was identified by both the appendix and ileocecal valve. No adverse events experienced.   The quality of the prep was good, using MoviPrep  The instrument was then slowly withdrawn as the colon was fully examined.      COLON FINDINGS: There was moderate diverticulosis noted in the sigmoid colon with associated muscular hypertrophy, colonic narrowing, angulation and tortuosity.   Two polypoid shaped semi-pedunculated polyps measuring 15 mm in size with friable surfaces were found in the sigmoid colon.  A polypectomy was performed with a cold snare.  The resection was complete, the polyp tissue was completely retrieved and sent to histology.  Retroflexed views revealed no abnormalities. The time to cecum=3 minutes 06 seconds.  Withdrawal time=10 minutes 03 seconds.  The scope was withdrawn and the procedure completed. COMPLICATIONS: There were no immediate  complications.  ENDOSCOPIC IMPRESSION: 1.   There was moderate diverticulosis noted in the sigmoid colon 2.   Two semi-pedunculated polyps were found in the sigmoid colon at 30 cm, polypectomy was performed with a cold snare , polypd appear inflammatory  RECOMMENDATIONS: 1.  Await biopsy results 2.  High fiber diet Fiber supplements Recall colonoscopy pending path report  eSigned:  Lafayette Dragon, MD 08/28/2014 2:30 PM   cc:   PATIENT NAME:  Chad, [name] MR#: [id number]

## 2014-08-28 NOTE — Progress Notes (Signed)
Pt coughed up small amt of blood tinged sputum before DC from RR

## 2014-08-28 NOTE — Patient Instructions (Addendum)
YOU HAD AN ENDOSCOPIC PROCEDURE TODAY AT [facility]: Refer to the procedure report that was given to you for any specific questions about what was found during the examination.  If the procedure report does not answer your questions, please call your gastroenterologist to clarify.  If you requested that your care partner not be given the details of your procedure findings, then the procedure report has been included in a sealed envelope for you to review at your convenience later.  YOU SHOULD EXPECT: Some feelings of bloating in the abdomen. Passage of more gas than usual.  Walking can help get rid of the air that was put into your GI tract during the procedure and reduce the bloating. If you had a lower endoscopy (such as a colonoscopy or flexible sigmoidoscopy) you may notice spotting of blood in your stool or on the toilet paper. If you underwent a bowel prep for your procedure, then you may not have a normal bowel movement for a few days.  DIET: follow dilation diet today- see handout.  Drink plenty of fluids but you should avoid alcoholic beverages for 24 hours.  ACTIVITY: Your care partner should take you home directly after the procedure.  You should plan to take it easy, moving slowly for the rest of the day.  You can resume normal activity the day after the procedure however you should NOT DRIVE or use heavy machinery for 24 hours (because of the sedation medicines used during the test).    SYMPTOMS TO REPORT IMMEDIATELY: A gastroenterologist can be reached at any hour.  During normal business hours, 8:30 AM to 5:00 PM Monday through Friday, call [phone].  After hours and on weekends, please call the GI answering service at [phone] who will take a message and have the physician on call contact you.   Following lower endoscopy (colonoscopy or flexible sigmoidoscopy):  Excessive amounts of blood in the stool  Significant tenderness or worsening of abdominal  pains  Swelling of the abdomen that is new, acute  Fever of 100F or higher  Following upper endoscopy (EGD)  Vomiting of blood or coffee ground material  New chest pain or pain under the shoulder blades  Painful or persistently difficult swallowing  New shortness of breath  Fever of 100F or higher  Black, tarry-looking stools  FOLLOW UP: If any biopsies were taken you will be contacted by phone or by letter within the next 1-3 weeks.  Call your gastroenterologist if you have not heard about the biopsies in 3 weeks.  Our staff will call the home number listed on your records the next business day following your procedure to check on you and address any questions or concerns that you may have at that time regarding the information given to you following your procedure. This is a courtesy call and so if there is no answer at the home number and we have not heard from you through the emergency physician on call, we will assume that you have returned to your regular daily activities without incident.  SIGNATURES/CONFIDENTIALITY: You and/or your care partner have signed paperwork which will be entered into your electronic medical record.  These signatures attest to the fact that that the information above on your After Visit Summary has been reviewed and is understood.  Full responsibility of the confidentiality of this discharge information lies with you and/or your care-partner.  Please read over handouts about esophagitis, stricture, polyps, diverticulosis, and high fiber diets  You might  note some irritation in your nose or some drainage.  This may cause feelings of congestion.  This is from the oxygen, which can be irritating.  There is no need for concern, this should clear up in a day or so.  Continue your normal medications  Your prescription was sent to your [facility] pharmacy

## 2014-08-28 NOTE — Op Note (Signed)
[place]  [address] [place], [address]   ENDOSCOPY PROCEDURE REPORT  PATIENT: [name], Chad  MR#: [id number] BIRTHDATE: [birth year] , 53  yrs. old GENDER: male ENDOSCOPIST: Lafayette Dragon, MD REFERRED BY:  Sandie Ano NP PROCEDURE DATE:  08/28/2014 PROCEDURE:  EGD w/ biopsy and EGD w/ wire guided (savary) dilation  ASA CLASS:     Class II INDICATIONS:  history of eosinophilic esophagitis since 2001. Status post dilation in 2001, 2004, 2005 and in March 2013, recurrent dysphagia. MEDICATIONS: Monitored anesthesia care and Propofol 200 mg IV TOPICAL ANESTHETIC: none  DESCRIPTION OF PROCEDURE: After the risks benefits and alternatives of the procedure were thoroughly explained, informed consent was obtained.  The LB [id number] endoscope was introduced through the mouth and advanced to the second portion of the duodenum , Without limitations.  The instrument was slowly withdrawn as the mucosa was fully examined.    Esophagus: esophageal lumen was narrow concentrically throughout the proximal mid and distal esophagus. Mucosa was somewhat friable and as the endoscope  traverse through there were h tiny superficial tears in the mucosa . There was a mild distal esophageal stricture which appeared benign. Biopsies were obtained to confirm eosinophilic esophagitis  Stomach: stomach was insufflated with air and showed 2-3 cm hiatal hernia. Gastric folds were normal gastric antrum , pyloric outlet were unremarkable. Retroflexion of the endoscope revealed normal fundus and cardia  Duodenum: duodenal bulb and descending duodenum was normal  Savory dilators then passed over a guidewire using 13 and 14 mm dilators. Large r was were not tried  due to superficial tears in the esophagus[   .there was small amount of blood on the dilator The scope was then withdrawn from the patient and the procedure completed.  COMPLICATIONS: There were no immediate  complications.  ENDOSCOPIC IMPRESSION: 1.eosinophilic esophagitis 2. Concentric esophageal narrowing of the lumen due to esophagitis 3. Dilation with Savary dilators with 13 and 14 mm 4. Biopsies of distal esophagus  RECOMMENDATIONS:   REPEAT EXAM:  eSigned:  Lafayette Dragon, MD 08/28/2014 2:24 PM    CC:  PATIENT NAME:  Chad, [name] MR#: [id number]

## 2014-08-28 NOTE — Progress Notes (Signed)
Called to room to assist during endoscopic procedure.  Patient ID and intended procedure confirmed with present staff. Received instructions for my participation in the procedure from the performing physician.  

## 2014-08-28 NOTE — Progress Notes (Signed)
Stable to RR 

## 2014-08-29 ENCOUNTER — Telehealth: Payer: Self-pay

## 2014-08-29 NOTE — Telephone Encounter (Signed)
No answer at work number.

## 2014-09-04 ENCOUNTER — Encounter: Payer: Self-pay | Admitting: Internal Medicine

## 2015-07-10 NOTE — Op Note (Signed)
[facility] [address] [place], [address]   COLONOSCOPY PROCEDURE REPORT  PATIENT: Shirk, Jorden L  MR#: [id number] BIRTHDATE: 02/19/1961 , 53  yrs. old GENDER: male ENDOSCOPIST: Yohance Hathorne M Lakenzie Mcclafferty, M REFERRED BY:Janice Daniel NP PROCEDURE DATE:  08/28/2014 PROCEDURE:   Colonoscopy with snare polypectomy First Screening Colonoscopy - Avg.  risk and is 50 yrs.  old or older Yes.  Prior Negative Screening - Now for repeat screening. N/A  History of Adenoma - Now for follow-up colonoscopy & has been > or = to 3 yrs.  N/A  Polyps Removed Today? Yes. ASA CLASS:   Class II INDICATIONS:recurrent episode of diverticulitis. MEDICATIONS: Monitored anesthesia care and Propofol 200 mg IV  DESCRIPTION OF PROCEDURE:   After the risks benefits and alternatives of the procedure were thoroughly explained, informed consent was obtained.  The digital rectal exam revealed no abnormalities of the rectum.   The LB [id number]  endoscope was introduced through the anus and advanced to the cecum, which was identified by both the appendix and ileocecal valve. No adverse events experienced.   The quality of the prep was good, using MoviPrep  The instrument was then slowly withdrawn as the colon was fully examined.      COLON FINDINGS: There was moderate diverticulosis noted in the sigmoid colon with associated muscular hypertrophy, colonic narrowing, angulation and tortuosity.   Two polypoid shaped semi-pedunculated polyps measuring 15 mm in size with friable surfaces were found in the sigmoid colon.  A polypectomy was performed with a cold snare.  The resection was complete, the polyp tissue was completely retrieved and sent to histology.  Retroflexed views revealed no abnormalities. The time to cecum=3 minutes 06 seconds.  Withdrawal time=10 minutes 03 seconds.  The scope was withdrawn and the procedure completed. COMPLICATIONS: There were no immediate  complications.  ENDOSCOPIC IMPRESSION: 1.   There was moderate diverticulosis noted in the sigmoid colon 2.   Two semi-pedunculated polyps were found in the sigmoid colon at 30 cm, polypectomy was performed with a cold snare , polypd appear inflammatory  RECOMMENDATIONS: 1.  Await biopsy results 2.  High fiber diet Fiber supplements Recall colonoscopy pending path report  eSigned:  Raynald Rouillard M Laynee Lockamy, MD 08/28/2014 2:30 PM   cc:   PATIENT NAME:  Vandegrift, Mccade L MR#: [id number]          

## 2015-08-01 ENCOUNTER — Ambulatory Visit: Payer: BLUE CROSS/BLUE SHIELD | Admitting: Gastroenterology

## 2015-11-15 IMAGING — CT CT ABD-PELV W/ CM
1 of 3 series · 13 of 32 positions shown, 18 images · IV contrast (OMNIPAQUE 300)
Comparison: CT of the abdomen and pelvis from 05/07/2010

CLINICAL DATA: Acute onset of bright red blood per rectum for 3
days. Left lower quadrant abdominal pain. Initial encounter.

EXAM:
CT ABDOMEN AND PELVIS WITH CONTRAST
TECHNIQUE: Multidetector CT imaging of the abdomen and pelvis was performed
using the standard protocol following bolus administration of
intravenous contrast.
CONTRAST:  100mL OMNIPAQUE IOHEXOL 300 MG/ML  SOLN

[Series 2: abd/pel with · axial · 0.90mm/px · z∈[+800,+1240]mm · 13 of 100 slices shown, 18 images]
[im 6/100  soft-tissue]
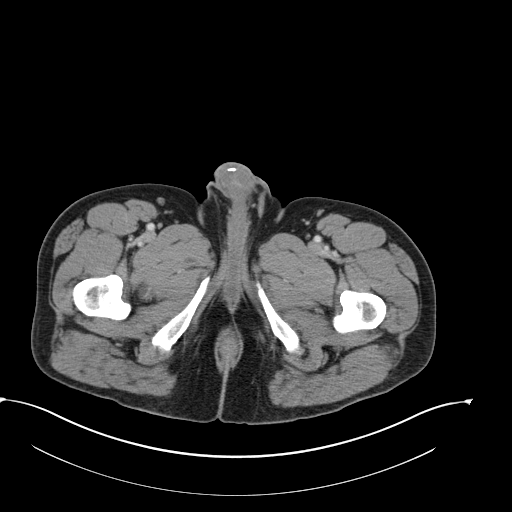
[im 6/100  bone]
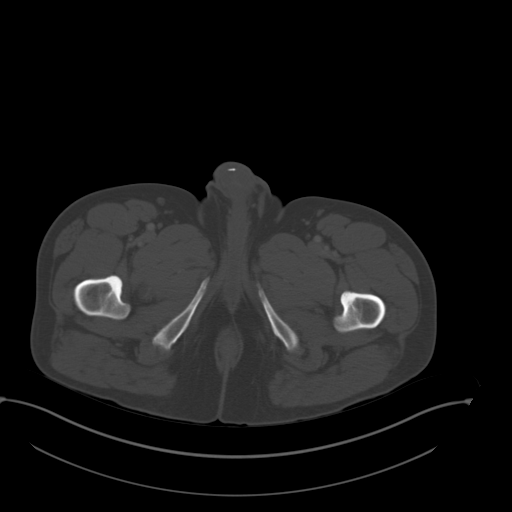
[im 17/100  soft-tissue]
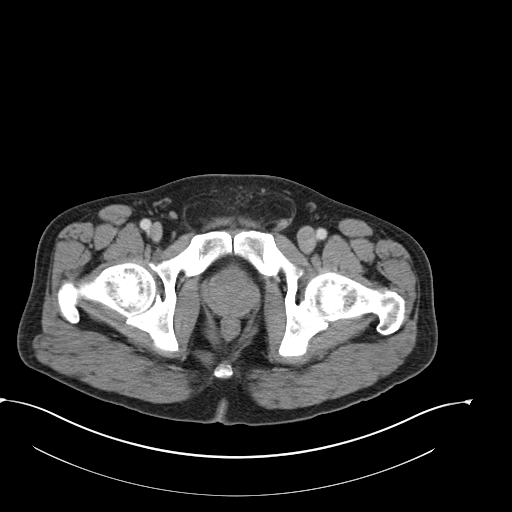
[im 23/100  soft-tissue]
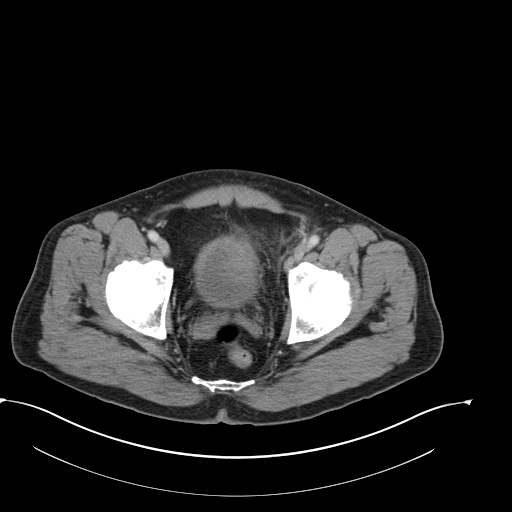
[im 28/100  soft-tissue]
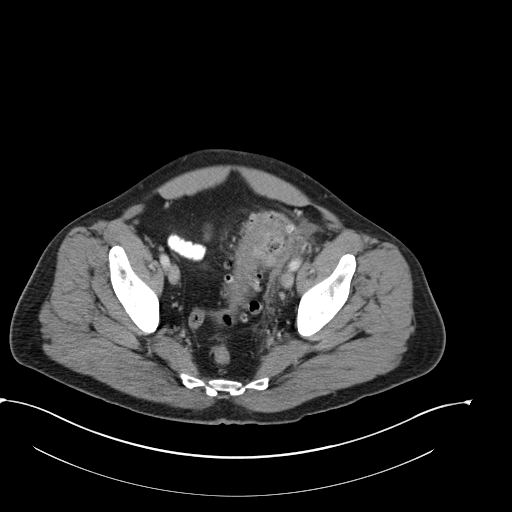
[im 39/100  soft-tissue]
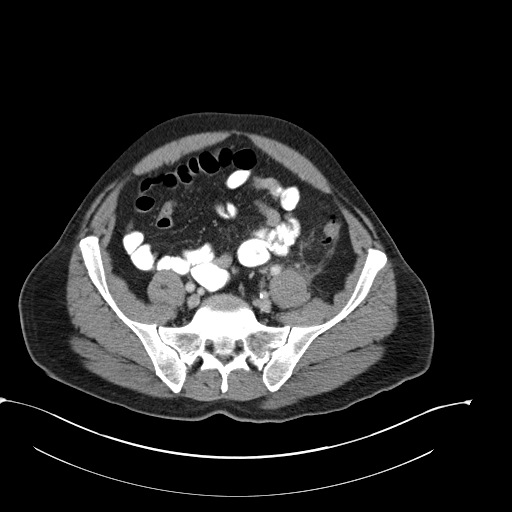
[im 45/100  soft-tissue]
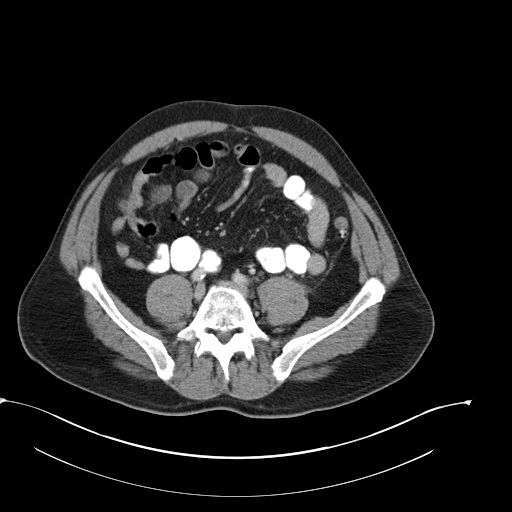
[im 56/100  soft-tissue]
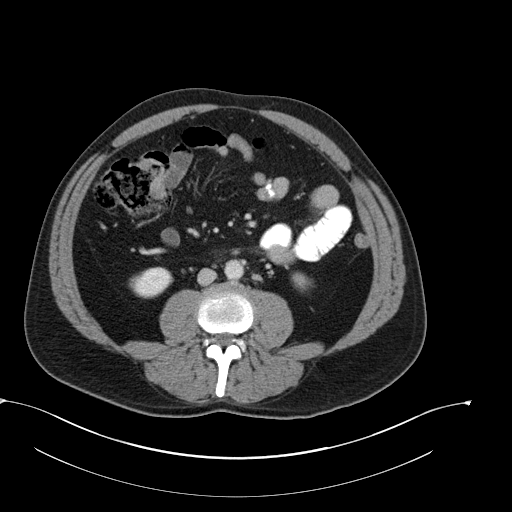
[im 61/100  soft-tissue]
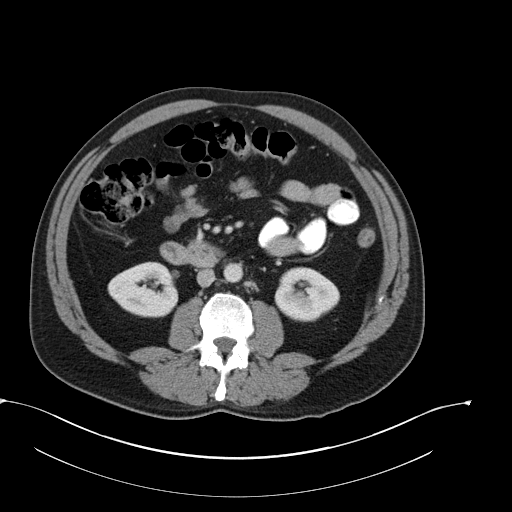
[im 72/100  soft-tissue]
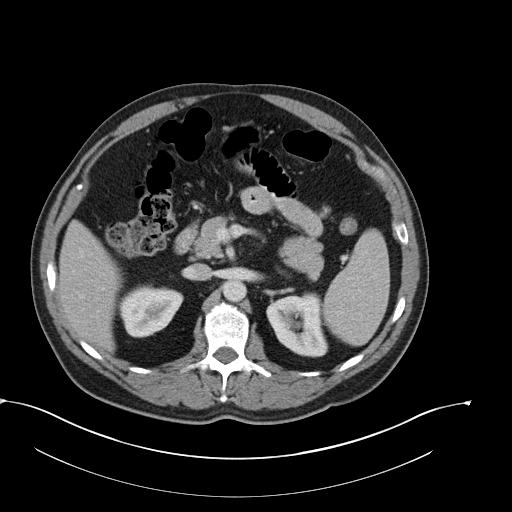
[im 72/100  bone]
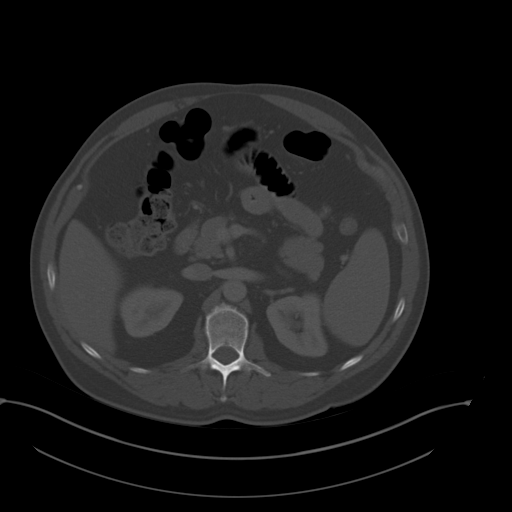
[im 78/100  soft-tissue]
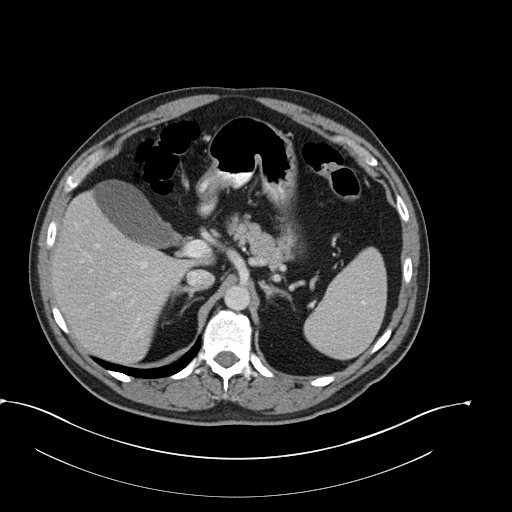
[im 78/100  lung]
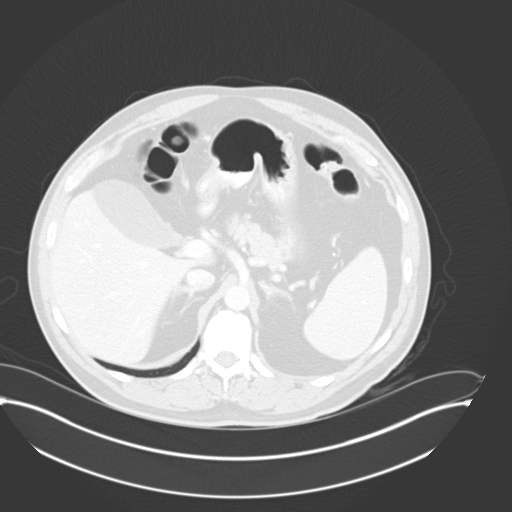
[im 83/100  soft-tissue]
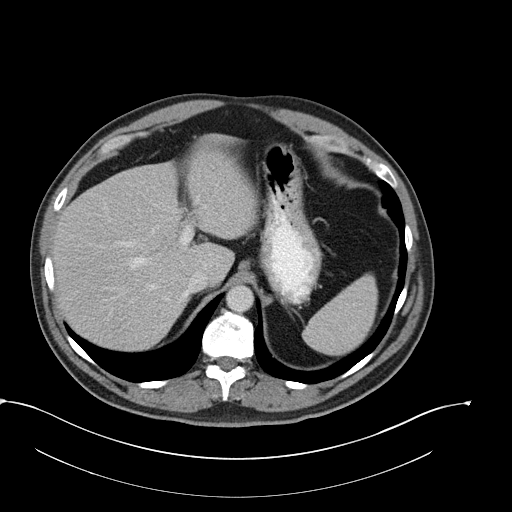
[im 83/100  lung]
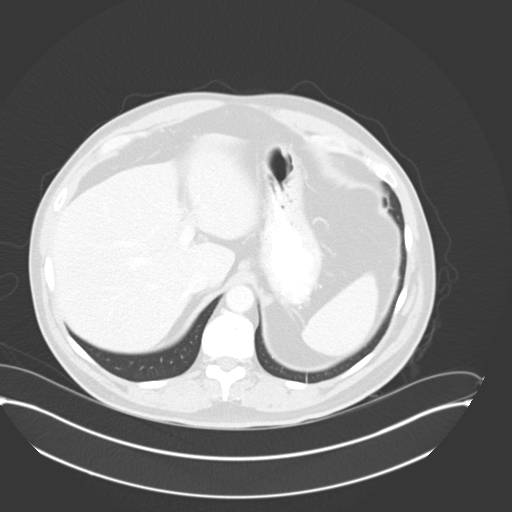
[im 89/100  lung]
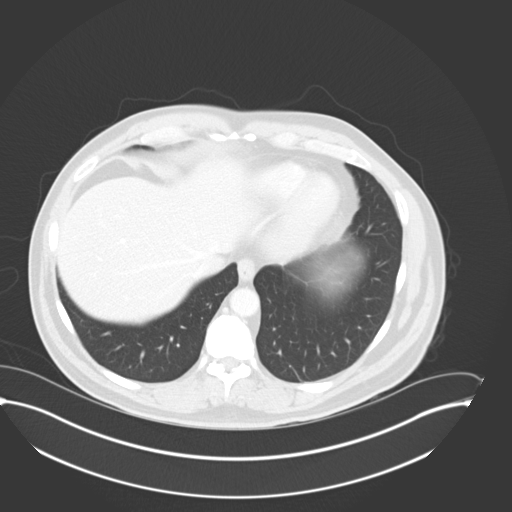
[im 94/100  soft-tissue]
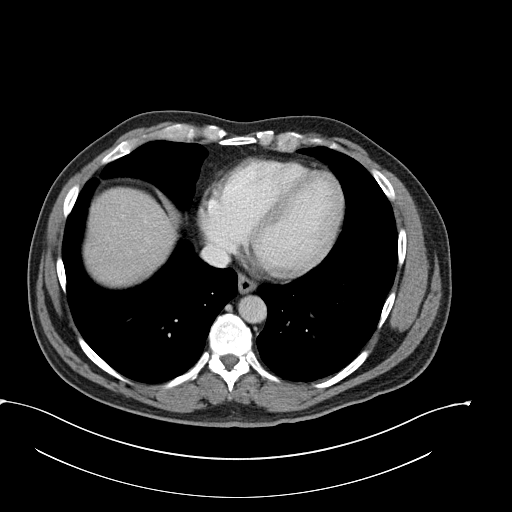
[im 94/100  lung]
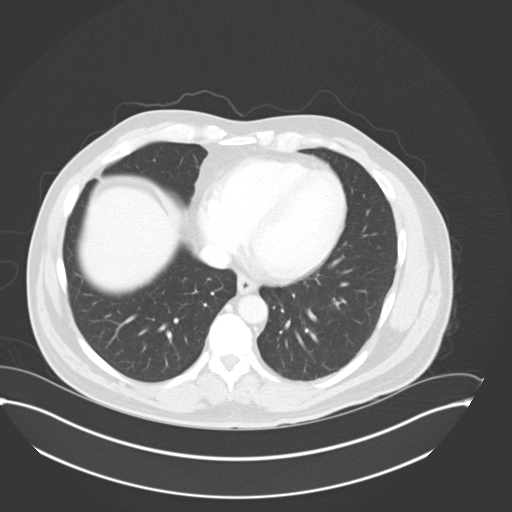

[13 of 32 positions shown; findings below may reference images not displayed]

FINDINGS: The visualized lung bases are clear.

The liver and spleen are unremarkable in appearance. The gallbladder
is within normal limits. The pancreas and adrenal glands are
unremarkable.

The kidneys are unremarkable in appearance. There is no evidence of
hydronephrosis. No renal or ureteral stones are seen. No perinephric
stranding is appreciated.

Transient intussusception of the small bowel at the right mid
abdomen is noted on delayed images, within normal limits. The small
bowel is unremarkable in appearance. The stomach is within normal
limits. No acute vascular abnormalities are seen. Minimal
calcification is seen along the common iliac arteries bilaterally.

The appendix is normal in caliber, without evidence for
appendicitis.

There is focal wall thickening at the proximal sigmoid colon,
associated inflamed diverticula and a small amount of associated
free fluid. Surrounding soft tissue inflammation is seen. Findings
are compatible with acute diverticulitis. There is no evidence of
perforation or abscess formation at this time.

Scattered diverticulosis is noted along the descending and proximal
sigmoid colon.

The bladder is mildly distended and grossly unremarkable. The
prostate is normal in size. Mildly improved scattered calcification
is noted within the penile shaft, of uncertain significance. No
inguinal lymphadenopathy is seen.

No acute osseous abnormalities are identified.
IMPRESSION: 1. Acute diverticulitis at the proximal sigmoid colon, with focal
wall thickening, soft tissue inflammation and associated free fluid.
No definite evidence of perforation or abscess formation at this
time.
2. Scattered diverticulosis involves the descending and proximal
sigmoid colon.
